# Patient Record
Sex: Male | Born: 1960 | Race: White | Hispanic: No | Marital: Married | Smoking: Never smoker
Health system: Southern US, Community
[De-identification: ages and names within clinical notes are randomized; demographics above are authoritative.]

## PROBLEM LIST (undated history)

## (undated) DIAGNOSIS — M1990 Unspecified osteoarthritis, unspecified site: Secondary | ICD-10-CM

## (undated) DIAGNOSIS — J019 Acute sinusitis, unspecified: Secondary | ICD-10-CM

## (undated) DIAGNOSIS — J309 Allergic rhinitis, unspecified: Secondary | ICD-10-CM

## (undated) DIAGNOSIS — T7840XA Allergy, unspecified, initial encounter: Secondary | ICD-10-CM

## (undated) DIAGNOSIS — R3919 Other difficulties with micturition: Secondary | ICD-10-CM

## (undated) DIAGNOSIS — E7800 Pure hypercholesterolemia, unspecified: Secondary | ICD-10-CM

## (undated) DIAGNOSIS — Z800 Family history of malignant neoplasm of digestive organs: Secondary | ICD-10-CM

## (undated) DIAGNOSIS — M542 Cervicalgia: Secondary | ICD-10-CM

## (undated) DIAGNOSIS — I10 Essential (primary) hypertension: Secondary | ICD-10-CM

## (undated) DIAGNOSIS — E785 Hyperlipidemia, unspecified: Secondary | ICD-10-CM

## (undated) DIAGNOSIS — J069 Acute upper respiratory infection, unspecified: Secondary | ICD-10-CM

## (undated) DIAGNOSIS — H9191 Unspecified hearing loss, right ear: Secondary | ICD-10-CM

## (undated) DIAGNOSIS — K921 Melena: Secondary | ICD-10-CM

## (undated) DIAGNOSIS — R001 Bradycardia, unspecified: Secondary | ICD-10-CM

## (undated) DIAGNOSIS — R079 Chest pain, unspecified: Secondary | ICD-10-CM

## (undated) HISTORY — PX: HERNIA MESH REMOVAL: SHX1745

## (undated) HISTORY — DX: Pure hypercholesterolemia, unspecified: E78.00

## (undated) HISTORY — DX: Essential (primary) hypertension: I10

## (undated) HISTORY — DX: Allergic rhinitis, unspecified: J30.9

## (undated) HISTORY — DX: Acute sinusitis, unspecified: J01.90

## (undated) HISTORY — DX: Unspecified hearing loss, right ear: H91.91

## (undated) HISTORY — PX: HAND SURGERY: SHX662

## (undated) HISTORY — DX: Bradycardia, unspecified: R00.1

## (undated) HISTORY — DX: Melena: K92.1

## (undated) HISTORY — PX: OTHER SURGICAL HISTORY: SHX169

## (undated) HISTORY — PX: COLONOSCOPY: SHX174

## (undated) HISTORY — DX: Chest pain, unspecified: R07.9

## (undated) HISTORY — DX: Allergy, unspecified, initial encounter: T78.40XA

## (undated) HISTORY — PX: TONSILLECTOMY: SUR1361

## (undated) HISTORY — DX: Other difficulties with micturition: R39.19

## (undated) HISTORY — DX: Unspecified osteoarthritis, unspecified site: M19.90

## (undated) HISTORY — DX: Hyperlipidemia, unspecified: E78.5

## (undated) HISTORY — DX: Cervicalgia: M54.2

## (undated) HISTORY — DX: Family history of malignant neoplasm of digestive organs: Z80.0

## (undated) HISTORY — DX: Acute upper respiratory infection, unspecified: J06.9

---

## 2004-10-02 ENCOUNTER — Ambulatory Visit: Payer: Self-pay | Admitting: Internal Medicine

## 2004-10-14 ENCOUNTER — Ambulatory Visit: Payer: Self-pay | Admitting: Internal Medicine

## 2007-04-22 ENCOUNTER — Ambulatory Visit: Payer: Self-pay | Admitting: Internal Medicine

## 2007-04-22 LAB — CONVERTED CEMR LAB
Albumin: 3.9 g/dL (ref 3.5–5.2)
Alkaline Phosphatase: 59 units/L (ref 39–117)
BUN: 11 mg/dL (ref 6–23)
Basophils Absolute: 0 10*3/uL (ref 0.0–0.1)
Bilirubin Urine: NEGATIVE
Cholesterol: 186 mg/dL (ref 0–200)
Eosinophils Absolute: 0.3 10*3/uL (ref 0.0–0.6)
GFR calc non Af Amer: 77 mL/min
HCT: 41.9 % (ref 39.0–52.0)
HDL: 38.2 mg/dL — ABNORMAL LOW (ref >39.0)
Hemoglobin, Urine: NEGATIVE
Hemoglobin: 14.4 g/dL (ref 13.0–17.0)
Ketones, ur: NEGATIVE mg/dL
LDL Cholesterol: 131 mg/dL — ABNORMAL HIGH (ref 0–99)
MCHC: 34.5 g/dL (ref 30.0–36.0)
MCV: 90.9 fL (ref 78.0–100.0)
Monocytes Absolute: 0.5 10*3/uL (ref 0.2–0.7)
Monocytes Relative: 8.6 % (ref 3.0–11.0)
Neutrophils Relative %: 57.1 % (ref 43.0–77.0)
PSA: 0.54 ng/mL (ref 0.10–4.00)
Potassium: 4.2 meq/L (ref 3.5–5.1)
RBC: 4.61 M/uL (ref 4.22–5.81)
RDW: 12.5 % (ref 11.5–14.6)
Sodium: 141 meq/L (ref 135–145)
TSH: 1.5 microintl units/mL (ref 0.35–5.50)
Triglycerides: 85 mg/dL (ref 0–149)
Urobilinogen, UA: 0.2 (ref 0.0–1.0)
VLDL: 17 mg/dL (ref 0–40)
pH: 7 (ref 5.0–8.0)

## 2007-05-09 ENCOUNTER — Ambulatory Visit: Payer: Self-pay | Admitting: Internal Medicine

## 2007-06-30 ENCOUNTER — Ambulatory Visit: Payer: Self-pay | Admitting: Internal Medicine

## 2007-06-30 LAB — CONVERTED CEMR LAB
AST: 26 units/L (ref 0–37)
Bilirubin, Direct: 0.2 mg/dL (ref 0.0–0.3)
Cholesterol: 177 mg/dL (ref 0–200)
HDL: 38.4 mg/dL — ABNORMAL LOW (ref >39.0)
Total Bilirubin: 1.1 mg/dL (ref 0.3–1.2)
Total CHOL/HDL Ratio: 4.6
Total Protein: 6.1 g/dL (ref 6.0–8.3)
Triglycerides: 78 mg/dL (ref 0–149)

## 2007-08-18 ENCOUNTER — Ambulatory Visit: Payer: Self-pay | Admitting: Internal Medicine

## 2007-08-18 DIAGNOSIS — E7800 Pure hypercholesterolemia, unspecified: Secondary | ICD-10-CM

## 2007-08-18 HISTORY — DX: Pure hypercholesterolemia, unspecified: E78.00

## 2007-08-18 LAB — CONVERTED CEMR LAB
ALT: 33 units/L (ref 0–53)
AST: 26 units/L (ref 0–37)
Alkaline Phosphatase: 51 units/L (ref 39–117)
Cholesterol: 139 mg/dL (ref 0–200)
LDL Cholesterol: 82 mg/dL (ref 0–99)
Total Bilirubin: 1 mg/dL (ref 0.3–1.2)
Total Protein: 6.2 g/dL (ref 6.0–8.3)

## 2007-09-27 ENCOUNTER — Telehealth: Payer: Self-pay | Admitting: Internal Medicine

## 2007-10-03 ENCOUNTER — Telehealth (INDEPENDENT_AMBULATORY_CARE_PROVIDER_SITE_OTHER): Payer: Self-pay | Admitting: *Deleted

## 2007-10-03 DIAGNOSIS — K921 Melena: Secondary | ICD-10-CM

## 2007-10-03 HISTORY — DX: Melena: K92.1

## 2007-10-21 ENCOUNTER — Ambulatory Visit: Payer: Self-pay | Admitting: Gastroenterology

## 2007-10-21 LAB — CONVERTED CEMR LAB
Basophils Absolute: 0 10*3/uL (ref 0.0–0.1)
Hemoglobin: 16 g/dL (ref 13.0–17.0)
MCHC: 35.8 g/dL (ref 30.0–36.0)
Monocytes Absolute: 0.5 10*3/uL (ref 0.2–0.7)
Monocytes Relative: 9.8 % (ref 3.0–11.0)
Neutro Abs: 3.3 10*3/uL (ref 1.4–7.7)
Platelets: 196 10*3/uL (ref 150–400)
RDW: 12.7 % (ref 11.5–14.6)

## 2007-11-14 ENCOUNTER — Encounter: Payer: Self-pay | Admitting: Internal Medicine

## 2007-11-14 ENCOUNTER — Ambulatory Visit: Payer: Self-pay | Admitting: Gastroenterology

## 2007-12-07 ENCOUNTER — Ambulatory Visit: Payer: Self-pay | Admitting: Internal Medicine

## 2007-12-07 DIAGNOSIS — J309 Allergic rhinitis, unspecified: Secondary | ICD-10-CM | POA: Insufficient documentation

## 2007-12-07 DIAGNOSIS — E785 Hyperlipidemia, unspecified: Secondary | ICD-10-CM

## 2007-12-07 DIAGNOSIS — R3919 Other difficulties with micturition: Secondary | ICD-10-CM

## 2007-12-07 DIAGNOSIS — R079 Chest pain, unspecified: Secondary | ICD-10-CM

## 2007-12-07 HISTORY — DX: Other difficulties with micturition: R39.19

## 2007-12-07 HISTORY — DX: Chest pain, unspecified: R07.9

## 2007-12-07 HISTORY — DX: Hyperlipidemia, unspecified: E78.5

## 2007-12-07 HISTORY — DX: Allergic rhinitis, unspecified: J30.9

## 2007-12-07 LAB — CONVERTED CEMR LAB
Bilirubin Urine: NEGATIVE
Leukocytes, UA: NEGATIVE
Specific Gravity, Urine: 1.01 (ref 1.000–1.03)
Total Protein, Urine: NEGATIVE mg/dL
Urine Glucose: NEGATIVE mg/dL
pH: 6.5 (ref 5.0–8.0)

## 2008-05-04 ENCOUNTER — Telehealth (INDEPENDENT_AMBULATORY_CARE_PROVIDER_SITE_OTHER): Payer: Self-pay | Admitting: *Deleted

## 2008-06-21 ENCOUNTER — Ambulatory Visit: Payer: Self-pay | Admitting: Internal Medicine

## 2008-06-21 LAB — CONVERTED CEMR LAB
Albumin: 4.2 g/dL (ref 3.5–5.2)
Alkaline Phosphatase: 50 units/L (ref 39–117)
BUN: 15 mg/dL (ref 6–23)
Calcium: 9.5 mg/dL (ref 8.4–10.5)
Eosinophils Relative: 6 % — ABNORMAL HIGH (ref 0.0–5.0)
GFR calc Af Amer: 93 mL/min
Glucose, Bld: 91 mg/dL (ref 70–99)
HCT: 46.7 % (ref 39.0–52.0)
Hemoglobin: 16.5 g/dL (ref 13.0–17.0)
Monocytes Absolute: 0.6 10*3/uL (ref 0.1–1.0)
Monocytes Relative: 9.9 % (ref 3.0–12.0)
Neutro Abs: 3.2 10*3/uL (ref 1.4–7.7)
Nitrite: NEGATIVE
Potassium: 4.7 meq/L (ref 3.5–5.1)
RBC: 4.98 M/uL (ref 4.22–5.81)
Total Protein, Urine: NEGATIVE mg/dL
Total Protein: 6.3 g/dL (ref 6.0–8.3)
WBC: 5.9 10*3/uL (ref 4.5–10.5)
pH: 7 (ref 5.0–8.0)

## 2008-06-29 ENCOUNTER — Ambulatory Visit: Payer: Self-pay | Admitting: Internal Medicine

## 2009-06-24 ENCOUNTER — Ambulatory Visit: Payer: Self-pay | Admitting: Internal Medicine

## 2009-06-24 LAB — CONVERTED CEMR LAB
ALT: 25 units/L (ref 0–53)
Albumin: 4.1 g/dL (ref 3.5–5.2)
Basophils Relative: 0 % (ref 0.0–3.0)
Bilirubin Urine: NEGATIVE
CO2: 31 meq/L (ref 19–32)
Chloride: 106 meq/L (ref 96–112)
Creatinine, Ser: 1.3 mg/dL (ref 0.4–1.5)
Eosinophils Absolute: 0.3 10*3/uL (ref 0.0–0.7)
HCT: 45.3 % (ref 39.0–52.0)
Hemoglobin, Urine: NEGATIVE
Hemoglobin: 15.6 g/dL (ref 13.0–17.0)
Ketones, ur: NEGATIVE mg/dL
Leukocytes, UA: NEGATIVE
Lymphs Abs: 1.4 10*3/uL (ref 0.7–4.0)
MCHC: 34.5 g/dL (ref 30.0–36.0)
MCV: 95.4 fL (ref 78.0–100.0)
Monocytes Absolute: 0.4 10*3/uL (ref 0.1–1.0)
Neutro Abs: 3.2 10*3/uL (ref 1.4–7.7)
Nitrite: NEGATIVE
PSA: 0.49 ng/mL (ref 0.10–4.00)
Potassium: 5 meq/L (ref 3.5–5.1)
RBC: 4.74 M/uL (ref 4.22–5.81)
Total CHOL/HDL Ratio: 3
Total Protein: 6.7 g/dL (ref 6.0–8.3)
Triglycerides: 73 mg/dL (ref 0.0–149.0)

## 2009-07-01 ENCOUNTER — Ambulatory Visit: Payer: Self-pay | Admitting: Internal Medicine

## 2009-11-12 ENCOUNTER — Telehealth: Payer: Self-pay | Admitting: Internal Medicine

## 2009-11-25 ENCOUNTER — Ambulatory Visit: Payer: Self-pay | Admitting: Internal Medicine

## 2009-11-25 DIAGNOSIS — J019 Acute sinusitis, unspecified: Secondary | ICD-10-CM

## 2009-11-25 HISTORY — DX: Acute sinusitis, unspecified: J01.90

## 2009-12-16 ENCOUNTER — Ambulatory Visit: Payer: Self-pay | Admitting: Internal Medicine

## 2009-12-16 LAB — CONVERTED CEMR LAB
ALT: 22 units/L (ref 0–53)
AST: 24 units/L (ref 0–37)
Bilirubin, Direct: 0.2 mg/dL (ref 0.0–0.3)
Total Bilirubin: 0.7 mg/dL (ref 0.3–1.2)
Total CHOL/HDL Ratio: 3
VLDL: 12 mg/dL (ref 0.0–40.0)

## 2009-12-18 ENCOUNTER — Telehealth: Payer: Self-pay | Admitting: Internal Medicine

## 2010-01-16 ENCOUNTER — Ambulatory Visit: Payer: Self-pay | Admitting: Internal Medicine

## 2010-01-16 LAB — CONVERTED CEMR LAB
Albumin: 4.2 g/dL (ref 3.5–5.2)
Alkaline Phosphatase: 42 units/L (ref 39–117)
Cholesterol: 140 mg/dL (ref 0–200)
LDL Cholesterol: 78 mg/dL (ref 0–99)
Total CHOL/HDL Ratio: 3
Total Protein: 6.2 g/dL (ref 6.0–8.3)
Triglycerides: 74 mg/dL (ref 0.0–149.0)
VLDL: 14.8 mg/dL (ref 0.0–40.0)

## 2010-04-28 ENCOUNTER — Telehealth: Payer: Self-pay | Admitting: Internal Medicine

## 2010-06-17 ENCOUNTER — Ambulatory Visit: Payer: Self-pay | Admitting: Internal Medicine

## 2010-06-17 LAB — CONVERTED CEMR LAB
AST: 22 units/L (ref 0–37)
Albumin: 4 g/dL (ref 3.5–5.2)
Alkaline Phosphatase: 41 units/L (ref 39–117)
Basophils Absolute: 0.1 10*3/uL (ref 0.0–0.1)
CO2: 28 meq/L (ref 19–32)
Chloride: 108 meq/L (ref 96–112)
Cholesterol: 156 mg/dL (ref 0–200)
Creatinine, Ser: 1.2 mg/dL (ref 0.4–1.5)
Eosinophils Absolute: 0.6 10*3/uL (ref 0.0–0.7)
HDL: 36.2 mg/dL — ABNORMAL LOW (ref >39.00)
Hemoglobin, Urine: NEGATIVE
Hemoglobin: 15.1 g/dL (ref 13.0–17.0)
LDL Cholesterol: 92 mg/dL (ref 0–99)
Lymphocytes Relative: 24.2 % (ref 12.0–46.0)
MCHC: 34.6 g/dL (ref 30.0–36.0)
Monocytes Relative: 8.3 % (ref 3.0–12.0)
Neutro Abs: 3.6 10*3/uL (ref 1.4–7.7)
Neutrophils Relative %: 56.6 % (ref 43.0–77.0)
Nitrite: NEGATIVE
Platelets: 152 10*3/uL (ref 150.0–400.0)
Potassium: 5.3 meq/L — ABNORMAL HIGH (ref 3.5–5.1)
RDW: 13.9 % (ref 11.5–14.6)
Specific Gravity, Urine: 1.02 (ref 1.000–1.030)
Total Bilirubin: 0.9 mg/dL (ref 0.3–1.2)
Total CHOL/HDL Ratio: 4
Total Protein, Urine: NEGATIVE mg/dL
Triglycerides: 138 mg/dL (ref 0.0–149.0)
Urobilinogen, UA: 0.2 (ref 0.0–1.0)
VLDL: 27.6 mg/dL (ref 0.0–40.0)

## 2010-07-07 ENCOUNTER — Ambulatory Visit: Payer: Self-pay | Admitting: Internal Medicine

## 2010-07-07 ENCOUNTER — Encounter: Payer: Self-pay | Admitting: Internal Medicine

## 2010-09-30 ENCOUNTER — Encounter: Payer: Self-pay | Admitting: Internal Medicine

## 2010-10-20 ENCOUNTER — Telehealth: Payer: Self-pay | Admitting: Internal Medicine

## 2010-11-11 NOTE — Progress Notes (Signed)
Summary: Lipitor  Phone Note Call from Patient Call back at Work Phone [phone]   Caller: Patient Summary of Call: pt called requesting to have chol meds changed to Lipitor because he has a savings coupon. Initial call taken by: Margaret Pyle, CMA,  December 18, 2009 1:55 PM  Follow-up for Phone Call        sounds good   - ok for lipitoir 20 mg per day - done escript  Ask for:  LAB only in 4 wks:    lipids 272.0                                                  hepatic function panel  v58.69 Follow-up by: Corwin Levins MD,  December 18, 2009 2:36 PM  Additional Follow-up for Phone Call Additional follow up Details #1::        labs scheduled 01/13/2010 and informed Additional Follow-up by: Margaret Pyle, CMA,  December 18, 2009 2:51 PM    New/Updated Medications: LIPITOR 20 MG TABS (ATORVASTATIN CALCIUM) 1po once daily Prescriptions: LIPITOR 20 MG TABS (ATORVASTATIN CALCIUM) 1po once daily  #30 x 11   Entered and Authorized by:   Corwin Levins MD   Signed by:   Corwin Levins MD on 12/18/2009   Method used:   [facility]        [facility]  [place] [address]       [address]       [place], [place]  [address]       Ph: [phone] or [phone]       Fax: [phone]   RxID:   [id number]

## 2010-11-11 NOTE — Progress Notes (Signed)
Summary: Labs  Phone Note Call from Patient Call back at Home Phone [phone]   Summary of Call: Patient left message on triage that he will not be in town the week before his CPX and would like to do the around Sept 2nd.  Left message on voicemail notifying patient that would be fine, only Medicare cannot do labs early/prior. Initial call taken by: Lucious Groves CMA,  April 28, 2010 3:47 PM

## 2010-11-11 NOTE — Assessment & Plan Note (Signed)
Summary: PHYSICAL--STC   Vital Signs:  Patient profile:   50 year old male Height:      68 inches Weight:      174.75 pounds BMI:     26.67 O2 Sat:      98 % on Room air Temp:     97.4 degrees F oral Pulse rate:   44 / minute BP sitting:   112 / 60  (left arm) Cuff size:   regular  Vitals Entered By: Zella Ball Ewing CMA Duncan Dull) (July 07, 2010 10:51 AM)  O2 Flow:  Room air  CC: ADult Physical/RE   CC:  ADult Physical/RE.  History of Present Illness: overall doing well, Pt denies CP, worsening sob, doe, wheezing, orthopnea, pnd, worsening LE edema, palps, dizziness or syncope  Pt denies new neuro symptoms such as headache, facial or extremity weakness  Pt denies polydipsia, polyuria.  Overall good compliance with meds, trying to follow low chol diet, wt stable, little excercise however No fever, wt loss, night sweats, loss of appetite or other constitutional symptoms   Preventive Screening-Counseling & Management      Drug Use:  no.    Problems Prior to Update: 1)  Sinusitis- Acute-nos  (ICD-461.9) 2)  Preventive Health Care  (ICD-V70.0) 3)  Dark Urine  (ICD-788.69) 4)  Chest Pain  (ICD-786.50) 5)  Allergic Rhinitis  (ICD-477.9) 6)  Hyperlipidemia  (ICD-272.4) 7)  Hematochezia  (ICD-578.1) 8)  Encounter For Long-term Use of Other Medications  (ICD-V58.69) 9)  Pure Hypercholesterolemia  (ICD-272.0)  Medications Prior to Update: 1)  Lipitor 20 Mg Tabs (Atorvastatin Calcium) .Marland Kitchen.. 1po Once Daily 2)  Adult Aspirin Ec Low Strength 81 Mg Tbec (Aspirin) .Marland Kitchen.. 1 By Mouth Once Daily 3)  Clarithromycin 500 Mg Tabs (Clarithromycin) .Marland Kitchen.. 1 By Mouth Two Times A Day (With Food)  Current Medications (verified): 1)  Lipitor 20 Mg Tabs (Atorvastatin Calcium) .Marland Kitchen.. 1po Once Daily 2)  Adult Aspirin Ec Low Strength 81 Mg Tbec (Aspirin) .Marland Kitchen.. 1 By Mouth Once Daily  Allergies (verified): 1)  ! 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         all otherwise negative per pt -    Physical Exam  General:  alert and well-developed. Head:  normocephalic and atraumatic.   Eyes:  vision grossly intact, pupils equal, and pupils round.   Ears:  R ear normal and L ear normal.   Nose:  no external deformity and no nasal discharge.   Mouth:  no gingival abnormalities and pharynx pink and moist.   Neck:  supple and no masses.   Lungs:  normal respiratory effort and normal breath sounds.   Heart:  normal rate and regular rhythm.   Abdomen:  soft, non-tender, and normal bowel sounds.   Msk:  no joint tenderness and no joint swelling.   Extremities:  no edema, no erythema  Neurologic:  cranial nerves II-XII intact and strength normal in all extremities.    Skin:  color normal and no rashes.   Psych:  not anxious appearing and not depressed appearing.     Impression & Recommendations:  Problem # 1:  Preventive Health Care (ICD-V70.0) Overall doing well, age appropriate education and counseling updated and referral for appropriate preventive services done unless declined, immunizations up to date or declined, diet counseling done if overweight, urged to quit smoking if smokes , most recent labs reviewed and current ordered if appropriate, ecg reviewed or declined (interpretation per ECG scanned in the EMR if done); information regarding Medicare Prevention requirements given if appropriate; speciality referrals updated as appropriate   Complete Medication List: 1)  Lipitor 20 Mg Tabs (Atorvastatin calcium) .Marland Kitchen.. 1po once daily 2)  Adult Aspirin Ec Low Strength 81 Mg Tbec (Aspirin) .Marland Kitchen.. 1 by mouth once daily  Other Orders: Admin 1st Vaccine ([address]) Flu Vaccine 25yrs + [phone])  Patient Instructions: 1)  you had the flu shot today 2)  Continue all previous medications as before this visit 3)  Please schedule a follow-up appointment in 1 year or sooner if needed Prescriptions: LIPITOR 20 MG TABS (ATORVASTATIN CALCIUM) 1po once daily  #90 x 3   Entered and Authorized by:   Corwin Levins MD   Signed by:   Corwin Levins MD on 07/07/2010   Method used:   Print then Give to Patient   RxID:   [id number]   Flu Vaccine Consent Questions     Do you have a history of severe allergic reactions to this vaccine? no    Any prior history of allergic reactions to egg and/or gelatin? no    Do you have a sensitivity to the preservative Thimersol? no    Do you have a past history of Guillan-Barre Syndrome? no    Do you currently have an acute febrile illness? no    Have you ever had a severe reaction to latex? no    Vaccine information given and explained to patient? yes    Are you currently pregnant? no    Lot Number:[id number]   Exp  Date:04/11/2011   Site Given  Left Deltoid IMbflu

## 2010-11-11 NOTE — Progress Notes (Signed)
Summary: Simvastatin  Phone Note Call from Patient Call back at Home Phone [phone]   Caller: Patient Summary of Call: pt called stating that Simvastatin is causing muscle cramps. pt is requesting alt Initial call taken by: Margaret Pyle, CMA,  November 12, 2009 4:53 PM  Follow-up for Phone Call        ok to try change to pravachol 40 mg per day - done escript; will need LAB only in 4 wks:  hepatic function panel  v58.69 Lipids 272.0 Follow-up by: Corwin Levins MD,  November 12, 2009 6:00 PM  Additional Follow-up for Phone Call Additional follow up Details #1::        labs scheduled for 12/11/2009. pt informed of labs and Rx Additional Follow-up by: Margaret Pyle, CMA,  November 13, 2009 9:01 AM    New/Updated Medications: PRAVACHOL 40 MG TABS (PRAVASTATIN SODIUM) 1 by mouth once daily Prescriptions: PRAVACHOL 40 MG TABS (PRAVASTATIN SODIUM) 1 by mouth once daily  #90 x 3   Entered and Authorized by:   Corwin Levins MD   Signed by:   Corwin Levins MD on 11/12/2009   Method used:   [facility]        [facility]  [place] [address]       [address]       [place], [place]  [address]       Ph: [phone] or [phone]       Fax: [phone]   RxID:   [id number]

## 2010-11-11 NOTE — Assessment & Plan Note (Signed)
Summary: SINUS PROBLEM  STC   Vital Signs:  Patient profile:   50 year old male Height:      68 inches Weight:      176.75 pounds BMI:     26.97 O2 Sat:      98 % on Room air Temp:     98.2 degrees F oral Pulse rate:   50 / minute BP sitting:   122 / 70  (left arm) Cuff size:   regular  Vitals Entered ByZella Ball Ewing (November 25, 2009 3:38 PM)  O2 Flow:  Room air CC: sinus congestion, headache/RE   CC:  sinus congestion and headache/RE.  History of Present Illness: here with 2 to 3 days osnet mild to mod facial pain, pressure, fever and greenish d/c, with slight ST, no cough and Pt denies CP, sob, doe, wheezing, orthopnea, pnd, worsening LE edema, palps, dizziness or syncope   Pt denies new neuro symptoms such as headache, facial or extremity weakness   Problems Prior to Update: 1)  Sinusitis- Acute-nos  (ICD-461.9) 2)  Preventive Health Care  (ICD-V70.0) 3)  Dark Urine  (ICD-788.69) 4)  Chest Pain  (ICD-786.50) 5)  Allergic Rhinitis  (ICD-477.9) 6)  Hyperlipidemia  (ICD-272.4) 7)  Hematochezia  (ICD-578.1) 8)  Encounter For Long-term Use of Other Medications  (ICD-V58.69) 9)  Pure Hypercholesterolemia  (ICD-272.0)  Medications Prior to Update: 1)  Pravachol 40 Mg Tabs (Pravastatin Sodium) .Marland Kitchen.. 1 By Mouth Once Daily 2)  Adult Aspirin Ec Low Strength 81 Mg Tbec (Aspirin) .Marland Kitchen.. 1 By Mouth Once Daily  Current Medications (verified): 1)  Pravachol 40 Mg Tabs (Pravastatin Sodium) .Marland Kitchen.. 1 By Mouth Once Daily 2)  Adult Aspirin Ec Low Strength 81 Mg Tbec (Aspirin) .Marland Kitchen.. 1 By Mouth Once Daily 3)  Clarithromycin 500 Mg Tabs (Clarithromycin) .Marland Kitchen.. 1 By Mouth Two Times A Day (With Food)  Allergies (verified): 1)  ! 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   Eyes:  vision grossly intact, pupils equal, and pupils round.   Ears:  bialt tm's red, sinus tender bilat Nose:  nasal dischargemucosal pallor and mucosal edema.   Mouth:  pharyngeal erythema and fair dentition.   Neck:  supple and cervical lymphadenopathy.   Lungs:  normal respiratory effort and normal breath sounds.   Heart:  normal rate and regular rhythm.   Extremities:  no edema, no erythema    Impression & Recommendations:  Problem # 1:  SINUSITIS- ACUTE-NOS (ICD-461.9)  with left ear fullness and fluid - tx with mucinex and antibx, f/u any worsening s/s  His updated medication list for this problem includes:    Clarithromycin 500 Mg Tabs (Clarithromycin) .Marland Kitchen... 1 by mouth two times a day (with food)  Complete Medication List: 1)  Pravachol 40 Mg Tabs (Pravastatin sodium) .Marland Kitchen.. 1 by mouth once daily 2)  Adult Aspirin Ec Low Strength 81 Mg Tbec (Aspirin) .Marland Kitchen.. 1 by mouth once daily 3)  Clarithromycin 500 Mg Tabs (Clarithromycin) .Marland KitchenMarland KitchenMarland Kitchen  1 by mouth two times a day (with food)  Patient Instructions: 1)  Please take all new medications as prescribed  2)  Continue all previous medications as before this visit  3)  You can also use Mucinex OTC or it's generic for congestion  4)  Please schedule a follow-up appointment Sept 2011 with CPX labs Prescriptions: CLARITHROMYCIN 500 MG TABS (CLARITHROMYCIN) 1 by mouth two times a day (with food)  #20 x 0   Entered and Authorized by:   Corwin Levins MD   Signed by:   Corwin Levins MD on 11/25/2009   Method used:   Print then Give to Patient   RxID:    [id number]

## 2010-11-13 NOTE — Progress Notes (Signed)
Summary: Generic Substitution  Phone Note From Pharmacy   Caller: [facility] Summary of Call: fax from [facility] requesting generic substitution for lipitor to save patient money. fax # [phone] Initial call taken by: Robin Ewing CMA Duncan Dull),  October 20, 2010 1:14 PM  Follow-up for Phone Call        generic ok  -  atorvastatin - ok to substitute if ok with pt  please notify pt Follow-up by: Corwin Levins MD,  October 20, 2010 1:16 PM  Additional Follow-up for Phone Call Additional follow up Details #1::        Called pt. left msg. to call back Additional Follow-up by: Robin Ewing CMA Duncan Dull),  October 20, 2010 1:29 PM    Additional Follow-up for Phone Call Additional follow up Details #2::    called pt. and he informed his prescription for Sept. he has not yet filled due to having Lipitor left from previous prescription. But his September 2011 prescription is for the generic. Follow-up by: Zella Ball Ewing CMA Duncan Dull),  October 20, 2010 5:12 PM

## 2010-11-13 NOTE — Consult Note (Signed)
Summary: [facility]  [facility]   Imported By: Lester Ferrum 10/08/2010 10:51:51  _____________________________________________________________________  External Attachment:    Type:   Image     Comment:   External Document

## 2010-11-18 ENCOUNTER — Encounter: Payer: Self-pay | Admitting: Internal Medicine

## 2010-11-18 ENCOUNTER — Ambulatory Visit (INDEPENDENT_AMBULATORY_CARE_PROVIDER_SITE_OTHER): Payer: Managed Care, Other (non HMO) | Admitting: Internal Medicine

## 2010-11-18 DIAGNOSIS — M542 Cervicalgia: Secondary | ICD-10-CM

## 2010-11-18 DIAGNOSIS — J069 Acute upper respiratory infection, unspecified: Secondary | ICD-10-CM | POA: Insufficient documentation

## 2010-11-18 DIAGNOSIS — J309 Allergic rhinitis, unspecified: Secondary | ICD-10-CM

## 2010-11-18 HISTORY — DX: Acute upper respiratory infection, unspecified: J06.9

## 2010-11-18 HISTORY — DX: Cervicalgia: M54.2

## 2010-11-24 ENCOUNTER — Telehealth: Payer: Self-pay | Admitting: Internal Medicine

## 2010-11-27 NOTE — Assessment & Plan Note (Signed)
Summary: BACK OF NECK PAIN---STC   Vital Signs:  Patient profile:   50 year old male Height:      68 inches Weight:      178 pounds BMI:     27.16 O2 Sat:      98 % on Room air Temp:     98 degrees F oral Pulse rate:   36 / minute BP sitting:   128 / 70  (left arm) Cuff size:   regular  Vitals Entered By: Zella Ball Ewing CMA (AAMA) (November 18, 2010 2:29 PM)  O2 Flow:  Room air  CC: Neck pain, sinus congestion/RE   CC:  Neck pain and sinus congestion/RE.  History of Present Illness: here with 2 wks onset URI symptoms with HA, fever, sinus pain, pressure, and colored d/c, gradually worsening with mild ST and nonprod cough but Pt denies CP, worsening sob, doe, wheezing, orthopnea, pnd, worsening LE edema, palps, dizziness or syncope  Pt denies new neuro symptoms such as facial or extremity weakness  .  Pt denies polydipsia, polyuria.   Overall good compliance with meds, trying to follow low chol diet, wt stable, little excercise however .  Also has post left upper aspect neck pain, without radiation, worse to turn head to the left , and not assoc with LUE pain/weak/numb, gait change, falls, bowel or bladder change, fever, wt loss.  No prior hx, no obvious inciting event such as heavy lifitng, fall or other injury.   Pt denies CP, worsening sob, doe, wheezing, orthopnea, pnd, worsening LE edema, palps, dizziness or syncope  Pt denies new neuro symptoms such as facial or extremity weakness  Does have mild nasal allergy symptoms ongoing intemittent with itch, sneeze prior to onset current sympoms for several months, without fever, headache, colored d/c.    Problems Prior to Update: 1)  Neck Pain  (ICD-723.1) 2)  Uri  (ICD-465.9) 3)  Sinusitis- Acute-nos  (ICD-461.9) 4)  Preventive Health Care  (ICD-V70.0) 5)  Dark Urine  (ICD-788.69) 6)  Chest Pain  (ICD-786.50) 7)  Allergic Rhinitis  (ICD-477.9) 8)  Hyperlipidemia  (ICD-272.4) 9)  Hematochezia  (ICD-578.1) 10)  Encounter For Long-term  Use of Other Medications  (ICD-V58.69) 11)  Pure Hypercholesterolemia  (ICD-272.0)  Medications Prior to Update: 1)  Lipitor 20 Mg Tabs (Atorvastatin Calcium) .Marland Kitchen.. 1po Once Daily 2)  Adult Aspirin Ec Low Strength 81 Mg Tbec (Aspirin) .Marland Kitchen.. 1 By Mouth Once Daily  Current Medications (verified): 1)  Lipitor 20 Mg Tabs (Atorvastatin Calcium) .Marland Kitchen.. 1po Once Daily 2)  Adult Aspirin Ec Low Strength 81 Mg Tbec (Aspirin) .Marland Kitchen.. 1 By Mouth Once Daily 3)  Azithromycin 250 Mg Tabs (Azithromycin) .... 2po Qd For 1 Day, Then 1po Qd For 4days, Then Stop  Allergies (verified): 1)  ! 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 Head:  normocephalic and atraumatic.   Eyes:  vision grossly intact, pupils equal, and pupils round.   Ears:  bilat tms' midl erythema, sinus nontender Nose:  nasal dischargemucosal pallor and mucosal edema.   Mouth:  pharyngeal erythema and fair dentition.   Neck:  supple and cervical lymphadenopathy.   Lungs:  normal respiratory effort and normal breath sounds.   Heart:  normal rate and regular rhythm.   Msk:  mild tender left upper paracervical area, no swelling,redness or rash, no spine tender Extremities:  no edema, no erythema  Neurologic:  strength normal in all extremities, sensation intact to light touch, gait normal, and DTRs symmetrical and normal.   Skin:  color normal and no rashes.   Psych:  not depressed  appearing and slightly anxious.     Impression & Recommendations:  Problem # 1:  URI (ICD-465.9)  His updated medication list for this problem includes:    Adult Aspirin Ec Low Strength 81 Mg Tbec (Aspirin) .Marland Kitchen... 1 by mouth once daily treat as above, f/u any worsening signs or symptoms = for zpack x 1  Instructed on symptomatic treatment. Call if symptoms persist or worsen.   Problem # 2:  NECK PAIN (ICD-723.1)  His updated medication list for this problem includes:    Adult Aspirin Ec Low Strength 81 Mg Tbec (Aspirin) .Marland Kitchen... 1 by mouth once daily left upper paracervical only - ? lyphadentiis related to #1 vs MSK strain vs underlying c-spine DJD/DDD - exam benign for cervical radiculpathy;  ok for otc advil as needed , f/u any worsening s/s  Discussed exercises and use of moist heat or cold and medication.   Problem # 3:  ALLERGIC RHINITIS (ICD-477.9)  Discussed use of allergy medications and environmental measures.   Ok for [employer] as needed   Complete Medication List: 1)  Lipitor 20 Mg Tabs (Atorvastatin calcium) .Marland Kitchen.. 1po once daily 2)  Adult Aspirin Ec Low Strength 81 Mg Tbec (Aspirin) .Marland Kitchen.. 1 by mouth once daily 3)  Azithromycin 250 Mg Tabs (Azithromycin) .... 2po qd for 1 day, then 1po qd for 4days, then stop  Patient Instructions: 1)  Please take all new medications as prescribed 2)  Continue all previous medications as before this visit  3)  Please call for muscle relaxer if the neck pain persists or gets worse 4)  Please schedule a follow-up appointment in Sept 2012 for CPX with labs Prescriptions: AZITHROMYCIN 250 MG TABS (AZITHROMYCIN) 2po qd for 1 day, then 1po qd for 4days, then stop  #6 x 1   Entered and Authorized by:   Corwin Levins MD   Signed by:   Corwin Levins MD on 11/18/2010   Method used:   Print then Give to Patient   RxID:   [id number]    Orders Added: 1)  Est. Patient Level IV [65784]

## 2010-12-03 NOTE — Progress Notes (Signed)
Summary: generic substitution  Phone Note From Pharmacy   Caller: [facility] Summary of Call: Pharmacy requesting generic substitution for lipitor in order for pt. to save money? Initial call taken by: Robin Ewing CMA Duncan Dull),  November 24, 2010 9:09 AM  Follow-up for Phone Call        lipitor is generic now  I dont think there is need to change , unless there is some other aspect to the issue, such as the insurance wont pay for even generic lipitor Follow-up by: Corwin Levins MD,  November 24, 2010 9:29 AM  Additional Follow-up for Phone Call Additional follow up Details #1::        Informed pharmacy Additional Follow-up by: Robin Ewing CMA Duncan Dull),  November 24, 2010 9:38 AM

## 2010-12-22 ENCOUNTER — Telehealth: Payer: Self-pay | Admitting: Internal Medicine

## 2010-12-30 NOTE — Progress Notes (Signed)
  Phone Note Refill Request Message from:  Fax from Pharmacy on December 22, 2010 1:20 PM  Refills Requested: Medication #1:  LIPITOR 20 MG TABS 1po once daily   Dosage confirmed as above?Dosage Confirmed   Notes: [facility] Initial call taken by: Robin Ewing CMA (AAMA),  December 22, 2010 1:21 PM    Prescriptions: LIPITOR 20 MG TABS (ATORVASTATIN CALCIUM) 1po once daily  #90 x 3   Entered by:   Scharlene Gloss CMA (AAMA)   Authorized by:   Corwin Levins MD   Signed by:   Scharlene Gloss CMA (AAMA) on 12/22/2010   Method used:   Faxed to ...       [facility] (mail-order)             , [place]         Ph: [phone]       Fax: [phone]   RxID:   [id number]

## 2011-02-24 NOTE — Assessment & Plan Note (Signed)
 HEALTHCARE                         GASTROENTEROLOGY OFFICE NOTE   Curtis, Jaime                     MRN:          [id number]  DATE:10/21/2007                            DOB:          29-Dec-1960    REASON FOR REFERRAL:  Dr. Jonny Ruiz asked me to evaluate Jaime Curtis in  consultation regarding history of intermittent rectal bleeding.   HISTORY OF PRESENT ILLNESS:  Jaime Curtis is a very pleasant 50 year old  man who has had several years of mild intermittent rectal bleeding at  the time of solid bowel movements.  He is not particularly constipated,  does not have to strain to move his bowels, but every once and a while  will see what sounds like a small amount of blood on the tissue paper or  dripping into the toilet.  He had this evaluated once when he was in  [place] living there.  He was told that it was from hemorrhoids, he did  not have a full colonoscopy, I think he just had an anoscopy in the  office.   REVIEW OF SYSTEMS:  Notable for stable weight, mild occasional abdominal  cramping, is not bothersome to him and otherwise his review of systems  is essentially normal and is available on his nursing intake sheet.   PAST MEDICAL HISTORY:  Elevated cholesterol.   CURRENT MEDICINES:  Simvastatin.   ALLERGIES:  SUDAFED.   SOCIAL HISTORY:  Married with 3 children, works as an Art gallery manager for  [employer].  Nonsmoker, occasionally drinks alcohol.   FAMILY HISTORY:  No colon cancer or colon polyps in family.   PHYSICAL EXAMINATION:  Five foot eight inches, 186 pounds, blood  pressure 132/68, pulse 60.  CONSTITUTIONAL:  Generally well-appearing.  NEUROLOGIC:  Alert and oriented x3.  EYES:  Extraocular movements intact.  MOUTH:  Oropharynx moist, no lesions.  NECK:  Supple, no lymphadenopathy.  CARDIOVASCULAR:  Heart regular rate and rhythm.  LUNGS:  Clear to auscultation bilaterally.  ABDOMEN:  Soft, nontender, nondistended, normal bowel sounds.  EXTREMITIES:  No lower extremity edema.  SKIN:  No rashes or lesions on visible extremities.   ASSESSMENT/PLAN:  A 50 year old man with mild intermittent rectal  bleeding.   First, Jaime Curtis does not appear to be anemic clinically, but I will  arrange for him to have a CBC just to be certain.  He had a normal  hemoglobin back in July of 2008 on routine lab testing.  His rectal  bleeding sounds like it is indeed hemorrhoidal in nature and fairly  mild.  That being said, I will arrange for him to have a full  colonoscopy performed at his soonest convenience to make sure that he  does not have any low-lying neoplastic causes or colitis.  I see no  reason for any further blood tests or imaging studies other than the CBC  I mentioned above.     Rachael Fee, MD  Electronically Signed    DPJ/MedQ  DD: 10/21/2007  DT: 10/21/2007  Job #: [id number]   cc:   Corwin Levins, MD

## 2011-06-23 DIAGNOSIS — K921 Melena: Secondary | ICD-10-CM

## 2011-06-27 ENCOUNTER — Other Ambulatory Visit: Payer: Self-pay | Admitting: Internal Medicine

## 2011-06-27 DIAGNOSIS — Z0000 Encounter for general adult medical examination without abnormal findings: Secondary | ICD-10-CM

## 2011-07-05 ENCOUNTER — Encounter: Payer: Self-pay | Admitting: Internal Medicine

## 2011-07-05 DIAGNOSIS — Z0001 Encounter for general adult medical examination with abnormal findings: Secondary | ICD-10-CM | POA: Insufficient documentation

## 2011-07-05 DIAGNOSIS — Z0000 Encounter for general adult medical examination without abnormal findings: Secondary | ICD-10-CM | POA: Insufficient documentation

## 2011-07-06 ENCOUNTER — Other Ambulatory Visit: Payer: Managed Care, Other (non HMO)

## 2011-07-06 ENCOUNTER — Other Ambulatory Visit (INDEPENDENT_AMBULATORY_CARE_PROVIDER_SITE_OTHER): Payer: Managed Care, Other (non HMO)

## 2011-07-06 DIAGNOSIS — Z0000 Encounter for general adult medical examination without abnormal findings: Secondary | ICD-10-CM

## 2011-07-06 LAB — COMPREHENSIVE METABOLIC PANEL
AST: 23 U/L (ref 0–37)
Albumin: 3.9 g/dL (ref 3.5–5.2)
Alkaline Phosphatase: 41 U/L (ref 39–117)
BUN: 19 mg/dL (ref 6–23)
Creatinine, Ser: 1.2 mg/dL (ref 0.4–1.5)
Potassium: 4.5 mEq/L (ref 3.5–5.1)
Total Bilirubin: 0.9 mg/dL (ref 0.3–1.2)

## 2011-07-06 LAB — CBC WITH DIFFERENTIAL/PLATELET
Basophils Relative: 0.8 % (ref 0.0–3.0)
Eosinophils Absolute: 0.3 10*3/uL (ref 0.0–0.7)
Lymphs Abs: 1.3 10*3/uL (ref 0.7–4.0)
MCHC: 33.3 g/dL (ref 30.0–36.0)
MCV: 94 fl (ref 78.0–100.0)
Monocytes Absolute: 0.5 10*3/uL (ref 0.1–1.0)
Neutrophils Relative %: 60 % (ref 43.0–77.0)
RBC: 4.75 Mil/uL (ref 4.22–5.81)

## 2011-07-06 LAB — URINALYSIS, ROUTINE W REFLEX MICROSCOPIC
Hgb urine dipstick: NEGATIVE
Ketones, ur: NEGATIVE
Total Protein, Urine: NEGATIVE
Urine Glucose: NEGATIVE

## 2011-07-06 LAB — LIPID PANEL
Cholesterol: 131 mg/dL (ref 0–200)
HDL: 40.1 mg/dL (ref >39.00)
LDL Cholesterol: 80 mg/dL (ref 0–99)
Total CHOL/HDL Ratio: 3
Triglycerides: 54 mg/dL (ref 0.0–149.0)

## 2011-07-08 ENCOUNTER — Encounter: Payer: Managed Care, Other (non HMO) | Admitting: Internal Medicine

## 2011-07-10 ENCOUNTER — Other Ambulatory Visit (INDEPENDENT_AMBULATORY_CARE_PROVIDER_SITE_OTHER): Payer: Managed Care, Other (non HMO)

## 2011-07-10 ENCOUNTER — Encounter: Payer: Self-pay | Admitting: Internal Medicine

## 2011-07-10 ENCOUNTER — Ambulatory Visit (INDEPENDENT_AMBULATORY_CARE_PROVIDER_SITE_OTHER): Payer: Managed Care, Other (non HMO) | Admitting: Internal Medicine

## 2011-07-10 VITALS — BP 122/70 | HR 40 | Temp 98.3°F | Ht 68.0 in | Wt 180.4 lb

## 2011-07-10 DIAGNOSIS — Z23 Encounter for immunization: Secondary | ICD-10-CM

## 2011-07-10 DIAGNOSIS — Z0000 Encounter for general adult medical examination without abnormal findings: Secondary | ICD-10-CM

## 2011-07-10 DIAGNOSIS — R001 Bradycardia, unspecified: Secondary | ICD-10-CM

## 2011-07-10 DIAGNOSIS — M722 Plantar fascial fibromatosis: Secondary | ICD-10-CM | POA: Insufficient documentation

## 2011-07-10 HISTORY — DX: Bradycardia, unspecified: R00.1

## 2011-07-10 NOTE — Assessment & Plan Note (Signed)
Mild, or advil prn, consider [place] orth[facility] - Dr Lestine Box or Victorino Dike for worsening

## 2011-07-10 NOTE — Patient Instructions (Addendum)
You had the flu shot today Please go to LAB in the Basement for the blood test - PSA Continue all other medications as before Please call the phone number [phone] (the PhoneTree System) for results of testing in 2-3 days;  When calling, simply dial the number, and when prompted enter the MRN number above (the Medical Record Number) and the # key, then the message should start. Please return in 1 year for your yearly visit, or sooner if needed, with Lab testing done 3-5 days before

## 2011-07-10 NOTE — Progress Notes (Signed)
Subjective:    Patient ID: Jaime Curtis, male    DOB: 01-01-1961, 50 y.o.   MRN: [id number]  HPI  Here for wellness and f/u;  Overall doing ok;  Pt denies CP, worsening SOB, DOE, wheezing, orthopnea, PND, worsening LE edema, palpitations, dizziness or syncope.  Pt denies neurological change such as new Headache, facial or extremity weakness.  Pt denies polydipsia, polyuria, or low sugar symptoms. Pt states overall good compliance with treatment and medications, good tolerability, and trying to follow lower cholesterol diet.  Pt denies worsening depressive symptoms, suicidal ideation or panic. No fever, wt loss, night sweats, loss of appetite, or other constitutional symptoms.  Pt states good ability with ADL's, low fall risk, home safety reviewed and adequate, no significant changes in hearing or vision, and occasionally active with exercise.  Has had 3 yrs ongoing plantar fasciitis type pain with running, despite good shoes.   Past Medical History  Diagnosis Date  . ALLERGIC RHINITIS 12/07/2007  . CHEST PAIN 12/07/2007  . DARK URINE 12/07/2007  . HEMATOCHEZIA 10/03/2007  . HYPERLIPIDEMIA 12/07/2007  . NECK PAIN 11/18/2010  . Pure hypercholesterolemia 08/18/2007  . SINUSITIS- ACUTE-NOS 11/25/2009  . URI 11/18/2010  . Bradycardia, sinus 07/10/2011   Past Surgical History  Procedure Date  . Inguinal herniorrhapy     left  . Tonsillectomy   . Hx of fractured right scapula     reports that he has never smoked. He does not have any smokeless tobacco history on file. He reports that he drinks alcohol. He reports that he does not use illicit drugs. family history includes Cancer in his maternal grandmother and Diabetes in his sister. Allergies  Allergen Reactions  . Pseudoephedrine    Current Outpatient Prescriptions on File Prior to Visit  Medication Sig Dispense Refill  . aspirin 81 MG tablet Take 81 mg by mouth daily.        Marland Kitchen atorvastatin (LIPITOR) 20 MG tablet Take 20 mg by mouth daily.          Review of Systems Review of Systems  Constitutional: Negative for diaphoresis, activity change, appetite change and unexpected weight change.  HENT: Negative for hearing loss, ear pain, facial swelling, mouth sores and neck stiffness.   Eyes: Negative for pain, redness and visual disturbance.  Respiratory: Negative for shortness of breath and wheezing.   Cardiovascular: Negative for chest pain and palpitations.  Gastrointestinal: Negative for diarrhea, blood in stool, abdominal distention and rectal pain.  Genitourinary: Negative for hematuria, flank pain and decreased urine volume.  Musculoskeletal: Negative for myalgias and joint swelling.  Skin: Negative for color change and wound.  Neurological: Negative for syncope and numbness.  Hematological: Negative for adenopathy.  Psychiatric/Behavioral: Negative for hallucinations, self-injury, decreased concentration and agitation.      Objective:   Physical Exam BP 122/70  Pulse 40  Temp(Src) 98.3 F (36.8 C) (Oral)  Ht 5\' 8"  (1.727 m)  Wt 180 lb 6 oz (81.818 kg)  BMI 27.43 kg/m2  SpO2 98% Physical Exam  VS noted Constitutional: Pt is oriented to person, place, and time. Appears well-developed and well-nourished.  HENT:  Head: Normocephalic and atraumatic.  Right Ear: External ear normal.  Left Ear: External ear normal.  Nose: Nose normal.  Mouth/Throat: Oropharynx is clear and moist.  Eyes: Conjunctivae and EOM are normal. Pupils are equal, round, and reactive to light.  Neck: Normal range of motion. Neck supple. No JVD present. No tracheal deviation present.  Cardiovascular: Normal rate, regular rhythm,  normal heart sounds and intact distal pulses.   Pulmonary/Chest: Effort normal and breath sounds normal.  Abdominal: Soft. Bowel sounds are normal. There is no tenderness.  Musculoskeletal: Normal range of motion. Exhibits no edema.  Lymphadenopathy:  Has no cervical adenopathy.  Neurological: Pt is alert and oriented to  person, place, and time. Pt has normal reflexes. No cranial nerve deficit.  Skin: Skin is warm and dry. No rash noted.  Psychiatric:  Has  normal mood and affect. Behavior is normal.     Assessment & Plan:

## 2011-07-10 NOTE — Assessment & Plan Note (Signed)

## 2011-07-10 NOTE — Progress Notes (Signed)
Addended by: Scharlene Gloss B on: 07/10/2011 03:58 PM   Modules accepted: Orders

## 2012-04-22 ENCOUNTER — Other Ambulatory Visit: Payer: Self-pay

## 2012-04-22 MED ORDER — ATORVASTATIN CALCIUM 20 MG PO TABS: 20.0000 mg | ORAL_TABLET | Freq: Every day | ORAL | Status: DC

## 2012-07-27 ENCOUNTER — Other Ambulatory Visit (INDEPENDENT_AMBULATORY_CARE_PROVIDER_SITE_OTHER): Payer: Managed Care, Other (non HMO)

## 2012-07-27 DIAGNOSIS — Z0000 Encounter for general adult medical examination without abnormal findings: Secondary | ICD-10-CM

## 2012-07-27 LAB — CBC WITH DIFFERENTIAL/PLATELET
Basophils Relative: 0.6 % (ref 0.0–3.0)
Eosinophils Relative: 4 % (ref 0.0–5.0)
Hemoglobin: 15.8 g/dL (ref 13.0–17.0)
MCV: 93.3 fl (ref 78.0–100.0)
Monocytes Absolute: 0.4 10*3/uL (ref 0.1–1.0)
Neutro Abs: 4.1 10*3/uL (ref 1.4–7.7)
Neutrophils Relative %: 66.8 % (ref 43.0–77.0)
RBC: 5.12 Mil/uL (ref 4.22–5.81)
WBC: 6.2 10*3/uL (ref 4.5–10.5)

## 2012-07-27 LAB — LIPID PANEL
HDL: 39.6 mg/dL (ref >39.00)
LDL Cholesterol: 106 mg/dL — ABNORMAL HIGH (ref 0–99)
Total CHOL/HDL Ratio: 4
Triglycerides: 91 mg/dL (ref 0.0–149.0)

## 2012-07-27 LAB — BASIC METABOLIC PANEL
Calcium: 9.2 mg/dL (ref 8.4–10.5)
Creatinine, Ser: 1.1 mg/dL (ref 0.4–1.5)

## 2012-07-27 LAB — URINALYSIS, ROUTINE W REFLEX MICROSCOPIC
Bilirubin Urine: NEGATIVE
Ketones, ur: NEGATIVE
Nitrite: NEGATIVE
Total Protein, Urine: NEGATIVE
Urine Glucose: NEGATIVE
pH: 6.5 (ref 5.0–8.0)

## 2012-07-27 LAB — HEPATIC FUNCTION PANEL
AST: 20 U/L (ref 0–37)
Albumin: 3.8 g/dL (ref 3.5–5.2)
Alkaline Phosphatase: 44 U/L (ref 39–117)
Total Bilirubin: 0.8 mg/dL (ref 0.3–1.2)

## 2012-08-01 ENCOUNTER — Ambulatory Visit (INDEPENDENT_AMBULATORY_CARE_PROVIDER_SITE_OTHER): Payer: Managed Care, Other (non HMO) | Admitting: Internal Medicine

## 2012-08-01 ENCOUNTER — Encounter: Payer: Self-pay | Admitting: Internal Medicine

## 2012-08-01 VITALS — BP 110/68 | HR 40 | Temp 97.6°F | Ht 68.0 in | Wt 185.2 lb

## 2012-08-01 DIAGNOSIS — Z23 Encounter for immunization: Secondary | ICD-10-CM

## 2012-08-01 DIAGNOSIS — M79671 Pain in right foot: Secondary | ICD-10-CM | POA: Insufficient documentation

## 2012-08-01 DIAGNOSIS — M79609 Pain in unspecified limb: Secondary | ICD-10-CM

## 2012-08-01 DIAGNOSIS — Z0000 Encounter for general adult medical examination without abnormal findings: Secondary | ICD-10-CM

## 2012-08-01 MED ORDER — ATORVASTATIN CALCIUM 20 MG PO TABS: 20.0000 mg | ORAL_TABLET | Freq: Every day | ORAL | Status: DC

## 2012-08-01 NOTE — Assessment & Plan Note (Addendum)

## 2012-08-01 NOTE — Progress Notes (Signed)
Subjective:    Patient ID: Jaime Curtis, male    DOB: Mar 04, 1961, 51 y.o.   MRN: [id number]  HPI Here for wellness and f/u;  Overall doing ok;  Pt denies CP, worsening SOB, DOE, wheezing, orthopnea, PND, worsening LE edema, palpitations, dizziness or syncope.  Pt denies neurological change such as new Headache, facial or extremity weakness.  Pt denies polydipsia, polyuria, or low sugar symptoms. Pt states overall good compliance with treatment and medications, good tolerability, and trying to follow lower cholesterol diet.  Pt denies worsening depressive symptoms, suicidal ideation or panic. No fever, wt loss, night sweats, loss of appetite, or other constitutional symptoms.  Pt states good ability with ADL's, low fall risk, home safety reviewed and adequate, no significant changes in hearing or vision, and occasionally active with exercise.  Does have ongoing recurrent medial right heal pain worse to run, so this tends to limit his activity.  For flu shot today Past Medical History  Diagnosis Date  . ALLERGIC RHINITIS 12/07/2007  . CHEST PAIN 12/07/2007  . DARK URINE 12/07/2007  . HEMATOCHEZIA 10/03/2007  . HYPERLIPIDEMIA 12/07/2007  . NECK PAIN 11/18/2010  . Pure hypercholesterolemia 08/18/2007  . SINUSITIS- ACUTE-NOS 11/25/2009  . URI 11/18/2010  . Bradycardia, sinus 07/10/2011   Past Surgical History  Procedure Date  . Inguinal herniorrhapy     left  . Tonsillectomy   . Hx of fractured right scapula     reports that he has never smoked. He does not have any smokeless tobacco history on file. He reports that he drinks alcohol. He reports that he does not use illicit drugs. family history includes Cancer in his maternal grandmother and Diabetes in his sister. Allergies  Allergen Reactions  . Pseudoephedrine    Current Outpatient Prescriptions on File Prior to Visit  Medication Sig Dispense Refill  . aspirin 81 MG tablet Take 81 mg by mouth daily.        Marland Kitchen DISCONTD: atorvastatin (LIPITOR)  20 MG tablet Take 1 tablet (20 mg total) by mouth daily.  90 tablet  0   Review of Systems Review of Systems  Constitutional: Negative for diaphoresis, activity change, appetite change and unexpected weight change.  HENT: Negative for hearing loss, ear pain, facial swelling, mouth sores and neck stiffness.   Eyes: Negative for pain, redness and visual disturbance.  Respiratory: Negative for shortness of breath and wheezing.   Cardiovascular: Negative for chest pain and palpitations.  Gastrointestinal: Negative for diarrhea, blood in stool, abdominal distention and rectal pain.  Genitourinary: Negative for hematuria, flank pain and decreased urine volume.  Musculoskeletal: Negative for myalgias and joint swelling.  Skin: Negative for color change and wound.  Neurological: Negative for syncope and numbness.  Hematological: Negative for adenopathy.  Psychiatric/Behavioral: Negative for hallucinations, self-injury, decreased concentration and agitation.      Objective:   Physical Exam BP 110/68  Pulse 40  Temp 97.6 F (36.4 C) (Oral)  Ht 5\' 8"  (1.727 m)  Wt 185 lb 4 oz (84.029 kg)  BMI 28.17 kg/m2  SpO2 98% Physical Exam  VS noted Constitutional: Pt is oriented to person, place, and time. Appears well-developed and well-nourished.  HENT:  Head: Normocephalic and atraumatic.  Right Ear: External ear normal.  Left Ear: External ear normal.  Nose: Nose normal.  Mouth/Throat: Oropharynx is clear and moist.  Eyes: Conjunctivae and EOM are normal. Pupils are equal, round, and reactive to light.  Neck: Normal range of motion. Neck supple. No JVD present. No  tracheal deviation present.  Cardiovascular: Normal rate, regular rhythm, normal heart sounds and intact distal pulses.   Pulmonary/Chest: Effort normal and breath sounds normal.  Abdominal: Soft. Bowel sounds are normal. There is no tenderness.  Musculoskeletal: Normal range of motion. Exhibits no edema.  Lymphadenopathy:  Has no  cervical adenopathy.  Neurological: Pt is alert and oriented to person, place, and time. Pt has normal reflexes. No cranial nerve deficit.  Skin: Skin is warm and dry. No rash noted.  Psychiatric:  Has  normal mood and affect. Behavior is normal.  Right medial heel without erythema, ulcer, swelling    Assessment & Plan:

## 2012-08-01 NOTE — Assessment & Plan Note (Signed)
Activity limiting, for ortho referral

## 2012-08-01 NOTE — Patient Instructions (Addendum)
Continue all other medications as before Your refill was sent today, as well as you are given the hardcopy for the lipitor for next yr You had the flu shot today You are otherwise up to date with prevention measures You will be contacted regarding the referral for: Dr Fields/orthopedic for the right heel Please continue your efforts at being more active, low cholesterol diet, and weight control. Please return in 1 year for your yearly visit, or sooner if needed, with Lab testing done 3-5 days before Please remember to sign up for My Chart at your earliest convenience, as this will be important to you in the future with finding out test results.

## 2012-08-30 ENCOUNTER — Encounter: Payer: Self-pay | Admitting: Sports Medicine

## 2012-08-30 ENCOUNTER — Ambulatory Visit (INDEPENDENT_AMBULATORY_CARE_PROVIDER_SITE_OTHER): Payer: Managed Care, Other (non HMO) | Admitting: Sports Medicine

## 2012-08-30 VITALS — BP 147/90 | HR 60 | Ht 68.0 in | Wt 175.0 lb

## 2012-08-30 DIAGNOSIS — M722 Plantar fascial fibromatosis: Secondary | ICD-10-CM

## 2012-08-30 DIAGNOSIS — M2170 Unequal limb length (acquired), unspecified site: Secondary | ICD-10-CM | POA: Insufficient documentation

## 2012-08-30 NOTE — Assessment & Plan Note (Signed)
Prostep insole fitted with wedged lift on RT  Walking gait improved Less pain noted

## 2012-08-30 NOTE — Progress Notes (Signed)
  Subjective:    Patient ID: Jaime Curtis, male    DOB: 22-Mar-1961, 51 y.o.   MRN: [id number]  HPI Referred courtesy of Dr Jonny Ruiz at [facility]  Pt presents to clinic for evaluation of rt heel pain that he has had for about 2 years. Pain is located near center and medial aspect of heel.  Running more on treadmill 2 yrs ago when pain began. Pain worse in the morning and the next day after running. Has not run as much in the past 2 yrs due to heel pain. Mileage before pain started was 15-20 mpw.  Using a prostep insole. Has done some rolling and stretching without much relief.   Review of Systems     Objective:   Physical Exam Pleasant, NAD  Seated- has high arch Standing shows Mild collapse of bilat long arches R>L Hypertrophy of 1st MTP joint R>l slight hammering toes 4-5 bilat Flattening of transverse arch on rt Good great toe motion bilat Prominent accessory navicular on rt  Rt Ankle: No visible erythema or swelling. Range of motion is full in all directions. Strength is 5/5 in all directions. Stable lateral and medial ligaments; squeeze test and kleiger test unremarkable; Talar dome nontender; No pain at base of 5th MT; No tenderness over cuboid; No tenderness over N spot or navicular prominence No tenderness on posterior aspects of lateral and medial malleolus No sign of peroneal tendon subluxations; Negative tarsal tunnel tinel's Able to walk 4 steps.  Rt leg 87 cm Lt leg 88.5 cm  MSK [place] There is thickening of PF at heel insertion with small spur on RT RT is 0.56 vs left of 0.40 cms 2 cms distal to heel PF is 0.33 on RT vs normal level of 0.2 No tear noted      Assessment & Plan:

## 2012-08-30 NOTE — Assessment & Plan Note (Signed)
[place] and clinical exam confirm moderate PFasciitis  Use standard stretches and HEP  Arch strap   Icing  OTC meds prn   I would like to recheck in 6 weeks as we would probably use a custom orthotic if no improvement

## 2012-10-03 ENCOUNTER — Encounter: Payer: Self-pay | Admitting: Sports Medicine

## 2012-10-03 ENCOUNTER — Ambulatory Visit (INDEPENDENT_AMBULATORY_CARE_PROVIDER_SITE_OTHER): Payer: Managed Care, Other (non HMO) | Admitting: Sports Medicine

## 2012-10-03 VITALS — BP 128/81 | HR 44 | Ht 68.0 in | Wt 175.0 lb

## 2012-10-03 DIAGNOSIS — M722 Plantar fascial fibromatosis: Secondary | ICD-10-CM

## 2012-10-03 NOTE — Progress Notes (Signed)
Patient ID: ROLEN CONGER, male   DOB: [birth year], 51 y.o.   MRN: [id number]  Patient returns for a followup of right plantar fasciitis  Doing the exercises and stretches he feels that he is made about 50% improvement  He also thinks that the arch straps help him  He is using an over-the-counter sports insert prostep -that also seems to help  He is walking okay but has held off running because it still is painful   Physical examination No acute distress Mild tenderness only at the insertion of the plantar fascia and the medial calcaneus  Exam from last visit noted: Seated- has high arch  Standing shows Mild collapse of bilat long arches R>L  Hypertrophy of 1st MTP joint R>l  slight hammering toes 4-5 bilat  Flattening of transverse arch on rt  Good great toe motion bilat  Prominent accessory navicular on rt  No tenderness over the first MTP joint or over the forefoot

## 2012-10-03 NOTE — Assessment & Plan Note (Signed)
Is making surprisingly good progress considering the length of time he has had plantar fasciitis  I suggested continuing using the inserts and arch straps  Keep up the stretches and exercises  Recheck with me in 2-3 months  He can return to running once he feels that he has had very minimal pain

## 2012-12-27 ENCOUNTER — Encounter: Payer: Self-pay | Admitting: Sports Medicine

## 2012-12-27 ENCOUNTER — Ambulatory Visit (INDEPENDENT_AMBULATORY_CARE_PROVIDER_SITE_OTHER): Payer: BC Managed Care – PPO | Admitting: Sports Medicine

## 2012-12-27 VITALS — BP 132/83 | HR 41 | Ht 68.0 in | Wt 175.0 lb

## 2012-12-27 DIAGNOSIS — M2170 Unequal limb length (acquired), unspecified site: Secondary | ICD-10-CM

## 2012-12-27 DIAGNOSIS — M7740 Metatarsalgia, unspecified foot: Secondary | ICD-10-CM | POA: Insufficient documentation

## 2012-12-27 DIAGNOSIS — M722 Plantar fascial fibromatosis: Secondary | ICD-10-CM

## 2012-12-27 DIAGNOSIS — M7750 Other enthesopathy of unspecified foot: Secondary | ICD-10-CM

## 2012-12-27 NOTE — Progress Notes (Signed)
  Subjective:    Patient ID: Jaime Curtis, male    DOB: [birth year], 53 y.o.   MRN: [id number]  HPI  Patient returns for a followup of right plantar fasciitis. Doing the exercises and stretches he feels that he is made about 75% improvement.  He is using an over-the-counter sports insert prosteps and is happy with these so far. He also has a heel lift in RT for leg length inequality. Complains of mild heel pain right after running short distances, but says this could be due to deconditioning.  Today, he wants to discuss numbness sensation of bilateral toes. Described as "cold" sensation of all toes, worse at fifth toes but all toes are affected. This started about 2-3 months ago after he was last seen at [facility]. Sensation comes and goes, notices it more at night when he takes off shoes/socks and goes to bed.   No hx of DM, non-smoker.  Denies any PMH or FH of autoimmune diseases.  Review of Systems Per HPI    Objective:   Physical Exam General: very pleasant, in no acute distress MSK:  RT foot Exam: Mild tenderness only at the insertion of the plantar fascia and the medial calcaneus.  Full ROM.  Standing shows collapse of bilateral long arches, RT > LT. Slight hammering of toes 2-5 bilaterally.  Flattening of transverse arch on RT.  Accessory navicular bone on RT. Skin: no clubbing or cyanosis are toes bilaterally; strong and equal pedal pulses bilaterally     Assessment & Plan:

## 2012-12-27 NOTE — Assessment & Plan Note (Signed)
Improving after 4 months of HEP and sports insoles.  Patient to continue HEP.  If after 6 months, patient still complains of symptoms, we can consider custom made orthotics.  Follow up in about 8 weeks if needed.

## 2012-12-27 NOTE — Assessment & Plan Note (Signed)
Cont with 3/4 length heel lift to correct gait

## 2012-12-27 NOTE — Assessment & Plan Note (Addendum)
RT metatarsalgia could be secondary to increased pressure on fat pads bilaterally from trying to correct plantar fasciitis.  We placed metatarsal pads to bilateral shoe insoles to see if this relieves pressure from the nerves located in this area.  Recommended daily Vitamin B-6.  Patient to call us in about 4 weeks to discuss progress.

## 2012-12-27 NOTE — Patient Instructions (Addendum)
We placed metatarsal pads on your insoles to keep pressure off the nerves.   Try this for about 4 weeks. Also take Vitamin B-6 once a day. Continue exercises for plantar fasciitis. Call us in about 4 weeks to discuss progress.

## 2013-04-07 ENCOUNTER — Encounter: Payer: Self-pay | Admitting: Internal Medicine

## 2013-04-07 ENCOUNTER — Ambulatory Visit (INDEPENDENT_AMBULATORY_CARE_PROVIDER_SITE_OTHER): Payer: BC Managed Care – PPO | Admitting: Internal Medicine

## 2013-04-07 VITALS — BP 122/72 | HR 41 | Temp 97.5°F | Ht 68.0 in | Wt 180.5 lb

## 2013-04-07 DIAGNOSIS — J309 Allergic rhinitis, unspecified: Secondary | ICD-10-CM

## 2013-04-07 DIAGNOSIS — R209 Unspecified disturbances of skin sensation: Secondary | ICD-10-CM

## 2013-04-07 DIAGNOSIS — L989 Disorder of the skin and subcutaneous tissue, unspecified: Secondary | ICD-10-CM

## 2013-04-07 DIAGNOSIS — R202 Paresthesia of skin: Secondary | ICD-10-CM

## 2013-04-07 NOTE — Progress Notes (Signed)
  Subjective:    Patient ID: TION TSE, male    DOB: October 02, 1961, 52 y.o.   MRN: [id number]  HPI  Here with c/o new lesion to mid left cheek for 2 mo, nontneder but red, now slightly raised and not healing. No trauma, fever, no pain. Also c/o new onset approx 6 mo toe numbness starting the great toes bilat, now involving all toes, but only toes, no feet or other, and no pain or weakness.  No trauma, fever, swelling, redness.  Overall good compliance with treatment, and good medicine tolerability, including the lipitor.  Pt denies chest pain, increased sob or doe, wheezing, orthopnea, PND, increased LE swelling, palpitations, dizziness or syncope.   Pt denies polydipsia, polyuria. Last TSH normal Past Medical History  Diagnosis Date  . ALLERGIC RHINITIS 12/07/2007  . CHEST PAIN 12/07/2007  . DARK URINE 12/07/2007  . HEMATOCHEZIA 10/03/2007  . HYPERLIPIDEMIA 12/07/2007  . NECK PAIN 11/18/2010  . Pure hypercholesterolemia 08/18/2007  . SINUSITIS- ACUTE-NOS 11/25/2009  . URI 11/18/2010  . Bradycardia, sinus 07/10/2011   Past Surgical History  Procedure Laterality Date  . Inguinal herniorrhapy      left  . Tonsillectomy    . Hx of fractured right scapula      reports that he has never smoked. He has never used smokeless tobacco. He reports that  drinks alcohol. He reports that he does not use illicit drugs. family history includes Cancer in his maternal grandmother and Diabetes in his sister. Allergies  Allergen Reactions  . Pseudoephedrine    Current Outpatient Prescriptions on File Prior to Visit  Medication Sig Dispense Refill  . aspirin 81 MG tablet Take 81 mg by mouth daily.        Marland Kitchen atorvastatin (LIPITOR) 20 MG tablet Take 1 tablet (20 mg total) by mouth daily.  90 tablet  3   No current facility-administered medications on file prior to visit.   Review of Systems  Constitutional: Negative for unexpected weight change, or unusual diaphoresis  HENT: Negative for tinnitus.   Eyes:  Negative for photophobia and visual disturbance.  Respiratory: Negative for choking and stridor.   Gastrointestinal: Negative for vomiting and blood in stool.  Genitourinary: Negative for hematuria and decreased urine volume.  Musculoskeletal: Negative for acute joint swelling Skin: Negative for color change and wound.  Neurological: Negative for tremors and numbness other than noted  Psychiatric/Behavioral: Negative for decreased concentration or  hyperactivity.       Objective:   Physical Exam BP 122/72  Pulse 41  Temp(Src) 97.5 F (36.4 C) (Oral)  Ht 5\' 8"  (1.727 m)  Wt 180 lb 8 oz (81.874 kg)  BMI 27.45 kg/m2  SpO2 98% VS noted,  Constitutional: Pt appears well-developed and well-nourished.  HENT: Head: NCAT.  Right Ear: External ear normal.  Left Ear: External ear normal.  Bilat tm's with mild erythema.  Max sinus areas mild tender.  Pharynx with mild erythema, no exudate Eyes: Conjunctivae and EOM are normal. Pupils are equal, round, and reactive to light.  Neck: Normal range of motion. Neck supple.  Cardiovascular: Normal rate and regular rhythm.   Pulmonary/Chest: Effort normal and breath sounds normal.  Neurological: Pt is alert. Not confused , motor 5/5, no numbness to LT today, DTR/gait intact Skin: Skin is warm. No erythema. Left mid cheek with approx 8 mm slightly raised red/purplish lesion nontender Psychiatric: Pt behavior is normal. Thought content normal. not nervous    Assessment & Plan:

## 2013-04-07 NOTE — Patient Instructions (Signed)
Please continue all other medications as before You will be contacted regarding the referral for: dermatology  Please remember to sign up for My Chart if you have not done so, as this will be important to you in the future with finding out test results, communicating by private email, and scheduling acute appointments online when needed.

## 2013-04-08 DIAGNOSIS — R202 Paresthesia of skin: Secondary | ICD-10-CM | POA: Insufficient documentation

## 2013-04-08 NOTE — Assessment & Plan Note (Signed)
For otc allegra prn,  to f/u any worsening symptoms or concerns  

## 2013-04-08 NOTE — Assessment & Plan Note (Signed)
?   Skin ca such as basal cell - for derm referral

## 2013-04-08 NOTE — Assessment & Plan Note (Signed)
Exam benign, to add labs to next visit labs (declines today)

## 2013-08-01 ENCOUNTER — Other Ambulatory Visit (INDEPENDENT_AMBULATORY_CARE_PROVIDER_SITE_OTHER): Payer: BC Managed Care – PPO

## 2013-08-01 DIAGNOSIS — R202 Paresthesia of skin: Secondary | ICD-10-CM

## 2013-08-01 DIAGNOSIS — R209 Unspecified disturbances of skin sensation: Secondary | ICD-10-CM

## 2013-08-01 DIAGNOSIS — Z0000 Encounter for general adult medical examination without abnormal findings: Secondary | ICD-10-CM

## 2013-08-01 LAB — SEDIMENTATION RATE: Sed Rate: 4 mm/hr (ref 0–22)

## 2013-08-01 LAB — CBC WITH DIFFERENTIAL/PLATELET
Basophils Absolute: 0.1 10*3/uL (ref 0.0–0.1)
Eosinophils Absolute: 0.4 10*3/uL (ref 0.0–0.7)
Eosinophils Relative: 5.3 % — ABNORMAL HIGH (ref 0.0–5.0)
MCV: 91.4 fl (ref 78.0–100.0)
Monocytes Absolute: 0.5 10*3/uL (ref 0.1–1.0)
Neutrophils Relative %: 66.8 % (ref 43.0–77.0)
Platelets: 180 10*3/uL (ref 150.0–400.0)
RDW: 13.7 % (ref 11.5–14.6)
WBC: 7.7 10*3/uL (ref 4.5–10.5)

## 2013-08-01 LAB — BASIC METABOLIC PANEL
BUN: 18 mg/dL (ref 6–23)
Calcium: 9.3 mg/dL (ref 8.4–10.5)
Chloride: 106 mEq/L (ref 96–112)
Creatinine, Ser: 1.2 mg/dL (ref 0.4–1.5)
GFR: 67.8 mL/min (ref >60.00)
Potassium: 4.9 mEq/L (ref 3.5–5.1)

## 2013-08-01 LAB — HEPATIC FUNCTION PANEL
ALT: 28 U/L (ref 0–53)
AST: 26 U/L (ref 0–37)
Albumin: 4.1 g/dL (ref 3.5–5.2)
Bilirubin, Direct: 0.1 mg/dL (ref 0.0–0.3)
Total Protein: 6.1 g/dL (ref 6.0–8.3)

## 2013-08-01 LAB — LIPID PANEL
Cholesterol: 150 mg/dL (ref 0–200)
LDL Cholesterol: 82 mg/dL (ref 0–99)
Triglycerides: 132 mg/dL (ref 0.0–149.0)
VLDL: 26.4 mg/dL (ref 0.0–40.0)

## 2013-08-01 LAB — URINALYSIS, ROUTINE W REFLEX MICROSCOPIC
Bilirubin Urine: NEGATIVE
Leukocytes, UA: NEGATIVE
Nitrite: NEGATIVE
Specific Gravity, Urine: 1.01 (ref 1.000–1.030)
pH: 6 (ref 5.0–8.0)

## 2013-08-01 LAB — PSA: PSA: 0.53 ng/mL (ref 0.10–4.00)

## 2013-08-02 LAB — ANA: Anti Nuclear Antibody(ANA): NEGATIVE

## 2013-08-03 LAB — PROTEIN ELECTROPHORESIS, SERUM
Albumin ELP: 66 % (ref 55.8–66.1)
Alpha-2-Globulin: 9.7 % (ref 7.1–11.8)
Beta 2: 2.8 % — ABNORMAL LOW (ref 3.2–6.5)
Beta Globulin: 5.4 % (ref 4.7–7.2)
Total Protein, Serum Electrophoresis: 6.2 g/dL (ref 6.0–8.3)

## 2013-08-11 ENCOUNTER — Ambulatory Visit (INDEPENDENT_AMBULATORY_CARE_PROVIDER_SITE_OTHER): Payer: BC Managed Care – PPO | Admitting: Internal Medicine

## 2013-08-11 ENCOUNTER — Encounter: Payer: Self-pay | Admitting: Internal Medicine

## 2013-08-11 ENCOUNTER — Ambulatory Visit (INDEPENDENT_AMBULATORY_CARE_PROVIDER_SITE_OTHER)
Admission: RE | Admit: 2013-08-11 | Discharge: 2013-08-11 | Disposition: A | Discharge status: Home / Self Care | Payer: BC Managed Care – PPO | Source: Ambulatory Visit | Attending: Internal Medicine | Admitting: Internal Medicine

## 2013-08-11 VITALS — BP 140/72 | HR 43 | Temp 99.0°F | Ht 68.0 in | Wt 182.0 lb

## 2013-08-11 DIAGNOSIS — I10 Essential (primary) hypertension: Secondary | ICD-10-CM | POA: Insufficient documentation

## 2013-08-11 DIAGNOSIS — R079 Chest pain, unspecified: Secondary | ICD-10-CM

## 2013-08-11 DIAGNOSIS — Z0000 Encounter for general adult medical examination without abnormal findings: Secondary | ICD-10-CM

## 2013-08-11 DIAGNOSIS — R202 Paresthesia of skin: Secondary | ICD-10-CM

## 2013-08-11 DIAGNOSIS — R209 Unspecified disturbances of skin sensation: Secondary | ICD-10-CM

## 2013-08-11 DIAGNOSIS — Z800 Family history of malignant neoplasm of digestive organs: Secondary | ICD-10-CM

## 2013-08-11 HISTORY — DX: Family history of malignant neoplasm of digestive organs: Z80.0

## 2013-08-11 MED ORDER — ATORVASTATIN CALCIUM 20 MG PO TABS: 20.0000 mg | ORAL_TABLET | Freq: Every day | ORAL | Status: DC

## 2013-08-11 MED ORDER — IRBESARTAN 75 MG PO TABS: 75.0000 mg | ORAL_TABLET | Freq: Every day | ORAL | Status: DC

## 2013-08-11 NOTE — Assessment & Plan Note (Signed)
?   msk vs other, for cxr

## 2013-08-11 NOTE — Patient Instructions (Addendum)
Please take all new medication as prescribed - the low dose blood pressure medication Please check your Blood Pressure on a regular basis, with the goal being less than 140/90 Please continue all other medications as before, and refills have been done Please have the pharmacy call with any other refills you may need. Please continue your efforts at being more active, low cholesterol diet, and weight control.  You will be contacted regarding the referral for: colonoscopy, due to your family history  Please go to the XRAY Department in the Basement (go straight as you get off the elevator) for the x-ray testing  You will be contacted by phone if any changes need to be made immediately.  Otherwise, you will receive a letter about your results with an explanation, but please check with MyChart first.  Please call if your tingling is worse, such as worsening numbness or pain or difficulty walking, to consider a nerve test for the lower extremities  Please return in 6 months, or sooner if needed (or 1 year if you feel your blood pressure at home is improved)

## 2013-08-11 NOTE — Assessment & Plan Note (Signed)
Mother with recent dz, pt then due for f/u colonscopy at this time

## 2013-08-11 NOTE — Assessment & Plan Note (Signed)
Tingling to feet, exam benign, cont to follow, consider emg/ncs

## 2013-08-11 NOTE — Progress Notes (Signed)
Subjective:    Patient ID: Jaime Curtis, male    DOB: 08/16/1961, 52 y.o.   MRN: [id number]  HPI  Here for wellness and f/u;  Overall doing ok;  Pt denies  worsening SOB, DOE, wheezing, orthopnea, PND, worsening LE edema, palpitations, dizziness or syncope, though has had 1 mo recurring left mid upper pleuritic sharp mild pain without other assoc symptoms.  May have had some left axillary LN's enlarged and tender, then resolved.   Pt denies neurological change such as new headache, facial or extremity weakness, but mentions again persistent mild tingling to the pain, would not really call it numbness, pain, and no weakness.  Pt denies polydipsia, polyuria, or low sugar symptoms. Pt states overall good compliance with treatment and medications, good tolerability, and has been trying to follow lower cholesterol diet.  Pt denies worsening depressive symptoms, suicidal ideation or panic. No fever, night sweats, wt loss, loss of appetite, or other constitutional symptoms.  Pt states good ability with ADL's, has low fall risk, home safety reviewed and adequate, no other significant changes in hearing or vision, and is active with daily cardio exercise.  Has lost 3 lbs from last yr.  Check BP at home and this year has been fairly consistently in the 145-150 range often, o/w asympt.    Mother with finding colon cancer recent, only required surgury. Past Medical History  Diagnosis Date  . ALLERGIC RHINITIS 12/07/2007  . CHEST PAIN 12/07/2007  . DARK URINE 12/07/2007  . HEMATOCHEZIA 10/03/2007  . HYPERLIPIDEMIA 12/07/2007  . NECK PAIN 11/18/2010  . Pure hypercholesterolemia 08/18/2007  . SINUSITIS- ACUTE-NOS 11/25/2009  . URI 11/18/2010  . Bradycardia, sinus 07/10/2011  . Family history of colon cancer 08/11/2013    mother   Past Surgical History  Procedure Laterality Date  . Inguinal herniorrhapy      left  . Tonsillectomy    . Hx of fractured right scapula      reports that he has never smoked. He has  never used smokeless tobacco. He reports that he drinks alcohol. He reports that he does not use illicit drugs. family history includes Cancer in his maternal grandmother; Diabetes in his sister. Allergies  Allergen Reactions  . Pseudoephedrine    Current Outpatient Prescriptions on File Prior to Visit  Medication Sig Dispense Refill  . aspirin 81 MG tablet Take 81 mg by mouth daily.         No current facility-administered medications on file prior to visit.     Review of Systems Constitutional: Negative for diaphoresis, activity change, appetite change or unexpected weight change.  HENT: Negative for hearing loss, ear pain, facial swelling, mouth sores and neck stiffness.   Eyes: Negative for pain, redness and visual disturbance.  Respiratory: Negative for shortness of breath and wheezing.   Cardiovascular: Negative for chest pain and palpitations.  Gastrointestinal: Negative for diarrhea, blood in stool, abdominal distention or other pain Genitourinary: Negative for hematuria, flank pain or change in urine volume.  Musculoskeletal: Negative for myalgias and joint swelling.  Skin: Negative for color change and wound.  Neurological: Negative for syncope and numbness. other than noted Hematological: Negative for adenopathy.  Psychiatric/Behavioral: Negative for hallucinations, self-injury, decreased concentration and agitation.      Objective:   Physical Exam BP 140/72  Pulse 43  Temp(Src) 99 F (37.2 C) (Oral)  Ht 5\' 8"  (1.727 m)  Wt 182 lb (82.555 kg)  BMI 27.68 kg/m2  SpO2 97% VS noted,  Constitutional: Pt  is oriented to person, place, and time. Appears well-developed and well-nourished.  Head: Normocephalic and atraumatic.  Right Ear: External ear normal.  Left Ear: External ear normal.  Nose: Nose normal.  Mouth/Throat: Oropharynx is clear and moist.  Eyes: Conjunctivae and EOM are normal. Pupils are equal, round, and reactive to light.  Neck: Normal range of  motion. Neck supple. No JVD present. No tracheal deviation present.  Cardiovascular: Normal rate, regular rhythm, normal heart sounds and intact distal pulses.   Pulmonary/Chest: Effort normal and breath sounds normal.  Abdominal: Soft. Bowel sounds are normal. There is no tenderness. No HSM  Musculoskeletal: Normal range of motion. Exhibits no edema.  Lymphadenopathy:  Has no cervical adenopathy.  Neurological: Pt is alert and oriented to person, place, and time. Pt has normal reflexes. No cranial nerve deficit.  Skin: Skin is warm and dry. No rash noted.  Psychiatric:  Has  normal mood and affect. Behavior is normal.     Assessment & Plan:

## 2013-08-11 NOTE — Assessment & Plan Note (Signed)

## 2013-08-11 NOTE — Assessment & Plan Note (Signed)
New onset, mild, to cont efforts at wt control, low salt diet, also for avapro 75 qd, f/u with BP at home, call in 2-4 wks if no change for consideration increase to 150 mg

## 2013-09-05 ENCOUNTER — Telehealth: Payer: Self-pay | Admitting: Gastroenterology

## 2013-09-05 NOTE — Telephone Encounter (Signed)
Pt has been given the recommendations from the guide lines the pt mother was 90 at diagnosis

## 2013-09-05 NOTE — Telephone Encounter (Signed)
Pt will call with any concerns or symptoms

## 2013-09-05 NOTE — Telephone Encounter (Signed)
How old is his mother at time of colon cancer diagnosis?  Guidelines show it only increases risk if 1st degree relative (such as his mother) is diagnosed younger than 93-51 years old.

## 2013-09-05 NOTE — Telephone Encounter (Signed)
Dr Jacobs please review  

## 2013-10-24 ENCOUNTER — Ambulatory Visit (INDEPENDENT_AMBULATORY_CARE_PROVIDER_SITE_OTHER): Payer: BC Managed Care – PPO | Admitting: Sports Medicine

## 2013-10-24 ENCOUNTER — Encounter: Payer: Self-pay | Admitting: Sports Medicine

## 2013-10-24 VITALS — BP 117/73 | HR 38 | Ht 68.0 in | Wt 182.0 lb

## 2013-10-24 DIAGNOSIS — M722 Plantar fascial fibromatosis: Secondary | ICD-10-CM

## 2013-10-24 DIAGNOSIS — R269 Unspecified abnormalities of gait and mobility: Secondary | ICD-10-CM | POA: Insufficient documentation

## 2013-10-24 NOTE — Progress Notes (Signed)
Patient ID: Christene SlatesSteven G Thiem, male   DOB: 10/26/1960, 53 y.o.   MRN: [id number]  Runner with hx of PF Now improving with little pain However he has lost his long arch Using an OTC orthotic but does not feel enough support Would like to get into more running Comes for consideration of custom orthotics  Exam NAD BP 117/73  Pulse 38  Ht 5\' 8"  (1.727 m)  Wt 182 lb (82.555 kg)  BMI 27.68 kg/m2  Feet show loss of long arch No real TTP at PF Food great toe motion  Gait is pronated Rt leg is 1 cm shorter

## 2013-10-24 NOTE — Assessment & Plan Note (Signed)
This has improved with much less pain  Keep up stretches  Orthotics should help

## 2013-10-24 NOTE — Assessment & Plan Note (Signed)
Patient was fitted for a : standard, cushioned, semi-rigid orthotic. The orthotic was heated and afterward the patient stood on the orthotic blank positioned on the orthotic stand. The patient was positioned in subtalar neutral position and 10 degrees of ankle dorsiflexion in a weight bearing stance. After completion of molding, a stable base was applied to the orthotic blank. The blank was ground to a stable position for weight bearing. Size:11 red EVA Base: blue EVA Posting: RT foam lift Additional orthotic padding:O  Gait after completion was neutral and felt comfortable  Try these but if helpful will consider making for work shoes as well  Face to face time 45 mins

## 2014-02-02 ENCOUNTER — Encounter (INDEPENDENT_AMBULATORY_CARE_PROVIDER_SITE_OTHER): Payer: BC Managed Care – PPO

## 2014-02-02 ENCOUNTER — Encounter: Payer: Self-pay | Admitting: Radiology

## 2014-02-02 ENCOUNTER — Ambulatory Visit (INDEPENDENT_AMBULATORY_CARE_PROVIDER_SITE_OTHER): Payer: BC Managed Care – PPO | Admitting: Family Medicine

## 2014-02-02 ENCOUNTER — Telehealth: Payer: Self-pay | Admitting: Internal Medicine

## 2014-02-02 VITALS — BP 110/75 | HR 48 | Temp 97.4°F | Resp 18 | Ht 67.5 in | Wt 182.0 lb

## 2014-02-02 DIAGNOSIS — R42 Dizziness and giddiness: Secondary | ICD-10-CM

## 2014-02-02 DIAGNOSIS — R001 Bradycardia, unspecified: Secondary | ICD-10-CM

## 2014-02-02 DIAGNOSIS — J029 Acute pharyngitis, unspecified: Secondary | ICD-10-CM

## 2014-02-02 DIAGNOSIS — I498 Other specified cardiac arrhythmias: Secondary | ICD-10-CM

## 2014-02-02 LAB — POCT RAPID STREP A (OFFICE): Rapid Strep A Screen: NEGATIVE

## 2014-02-02 NOTE — Patient Instructions (Signed)
Any recurrent symptoms and you should return or go to the emergency room  Holter monitor is being scheduled for you

## 2014-02-02 NOTE — Progress Notes (Signed)
Subjective: Patient was [place] [employer] with his son this morning. He was walking with his son and noticed that his balance wasn't right. He had trouble with with his gait mostly. He was not dizzy. No upper extremity problems. He just felt like he was wobbly. His wife who was with him did not notice this, though he was noted by her to be a little anxious looking. Later he she asked what was the matter. They went to lunch and he said I'm just not feeling good. She brought in here. 3 days ago he had a bone anchor hearing device placed in his right posterior scalp by Dr. Tia MaskerKrouse. They did not contact him. Symptoms have improved now. History of hypertension and hyperlipidemia for which she is on medication.  In further questioning the patient said he felt a little lightheaded may be, felt like his feet did not hit well on the irregular ground, but never anything that made him sit down or lay down. At lunchtime he reclined a little and felt better gradually. He has a history of bradycardia for many years. Never symptomatic. Review of systems today was really otherwise unremarkable. Sore throat.  Objective: Healthy-appearing man in no acute distress. Throat is erythematous with some edema of the uvula. It has been swollen the last couple of days, having a general anesthesia a few days ago. TMs normal. Eyes PERRLA. Fundi benign. He has a plug in the skin of the skull about 3 inches behind the right ear. No carotid bruits. Chest clear. Heart bradycardic without murmur. A couple of possible mildly ectopic beats. Abdomen soft. Extremities normal. Finger to nose normal. Tandem walk normal. Romberg negative.    Results for orders placed in visit on 02/02/14  POCT RAPID STREP A (OFFICE)      Result Value Ref Range   Rapid Strep A Screen Negative  Negative    assessment: Lightheadedness with mild gait disturbance, resolved Resting bradycardia Pharyngitis secondary to recent anesthesia  Plan: Throat culture  will be done  EKG--bradycardia heart rate 35. Very similar pattern to previous EKGs except for minimal ST-T-wave flattening in V2.  Attempted to did not come back. Dr. Tia MaskerKrouse, unable to reach him.left message to call me back on my cell phone.   Will [facility] with [facility](where his wife is a Engineer, civil (consulting)nurse). According to her 1935-57 is common for him at rest, has never seen a cardiologist for evaluation. This has been a conversation at home for some time apparently.   [facility]. She is not concerned about the appearance of the EKG. Patient is having on over to the office to get a Holter monitor put on him this evening. Cautioned him to go to the emergency room or come here if any more events. Recommended to see a cardiologist sometime for further workup, which I believe his wife will schedule for him at the office.

## 2014-02-02 NOTE — Telephone Encounter (Signed)
New Message:  Dr. Frederik PearHopper's office is requesting Dr. Tenny Crawoss review an EKG. Stanton Kidneyebra, RN states she will call their office back.   [phone] Revonda Standard- Allison at Dr. Frederik PearHopper's office

## 2014-02-02 NOTE — Progress Notes (Signed)
Patient ID: Jaime Curtis, male   DOB: 03-01-1961, 53 y.o.   MRN: [id number] E cardio 24hr holter applied

## 2014-02-02 NOTE — Telephone Encounter (Signed)
Dr Tenny Crawross reviewed EKG. Pt to come by the office for holter monitor

## 2014-02-04 LAB — CULTURE, GROUP A STREP: Organism ID, Bacteria: NORMAL

## 2014-03-12 ENCOUNTER — Ambulatory Visit: Payer: BC Managed Care – PPO | Admitting: Physician Assistant

## 2014-07-27 ENCOUNTER — Other Ambulatory Visit: Payer: Self-pay

## 2014-08-19 ENCOUNTER — Other Ambulatory Visit: Payer: Self-pay | Admitting: Internal Medicine

## 2014-08-29 ENCOUNTER — Other Ambulatory Visit: Payer: Self-pay | Admitting: Internal Medicine

## 2014-09-27 ENCOUNTER — Ambulatory Visit (INDEPENDENT_AMBULATORY_CARE_PROVIDER_SITE_OTHER): Payer: BC Managed Care – PPO | Admitting: Family

## 2014-09-27 ENCOUNTER — Encounter: Payer: Self-pay | Admitting: Family

## 2014-09-27 VITALS — BP 134/88 | HR 49 | Temp 98.3°F | Resp 18 | Ht 68.0 in | Wt 179.1 lb

## 2014-09-27 DIAGNOSIS — J029 Acute pharyngitis, unspecified: Secondary | ICD-10-CM

## 2014-09-27 MED ORDER — AZITHROMYCIN 250 MG PO TABS: ORAL_TABLET | ORAL | Status: DC

## 2014-09-27 NOTE — Progress Notes (Signed)
Pre visit review using our clinic review tool, if applicable. No additional management support is needed unless otherwise documented below in the visit note. 

## 2014-09-27 NOTE — Progress Notes (Signed)
   Subjective:    Patient ID: Jaime Curtis, male    DOB: [birth year], 53 y.o.   MRN: [id number]  Chief Complaint  Patient presents with  . Sore Throat    x3 weeks, has had a sore throat no other symptoms, started off having trouble swallowing but now its more a "strange feeling"    HPI:  Jaime Curtis is a 53 y.o. male who presents today for an acute visit.   Acute symptoms of sore throat started about 3 weeks ago. Indicates that the soreness has gone away but now feels like there is something in the back of his throat. Denies any associated cough or fever. States that he may have had a little bit of congestion. Denies any heartburn or tightness in his chest. Denies having tried any treatments. Over the course of the 3 weeks it has gotten a little bit better. States that it waxes and wanes, sometimes feeling a little better and sometimes worse. Denies any difficulty breathing or shortness of breath.  Allergies  Allergen Reactions  . Pseudoephedrine     Current Outpatient Prescriptions on File Prior to Visit  Medication Sig Dispense Refill  . aspirin 81 MG tablet Take 81 mg by mouth daily.      Marland Kitchen. atorvastatin (LIPITOR) 20 MG tablet TAKE 1 TABLET DAILY 90 tablet 3  . cefPROZIL (CEFZIL) 500 MG tablet Take 500 mg by mouth 2 (two) times daily.    . irbesartan (AVAPRO) 75 MG tablet TAKE 1 TABLET (75 MG TOTAL) BY MOUTH DAILY. 90 tablet 0  . mupirocin ointment (BACTROBAN) 2 % Place 1 application into the nose 2 (two) times daily.     No current facility-administered medications on file prior to visit.    Review of Systems    See HPI   Objective:    BP 134/88 mmHg  Pulse 49  Temp(Src) 98.3 F (36.8 C) (Oral)  Resp 18  Ht 5\' 8"  (1.727 m)  Wt 179 lb 1.9 oz (81.248 kg)  BMI 27.24 kg/m2  SpO2 97% Nursing note and vital signs reviewed.  Physical Exam  Constitutional: He is oriented to person, place, and time. He appears well-developed and well-nourished. No distress.  HENT:    Right Ear: Hearing, tympanic membrane, external ear and ear canal normal.  Left Ear: Hearing, tympanic membrane, external ear and ear canal normal.  Nose: Nose normal.  Mouth/Throat: Uvula is midline. Posterior oropharyngeal erythema present. No oropharyngeal exudate.  Cardiovascular: Normal rate, regular rhythm, normal heart sounds and intact distal pulses.   Pulmonary/Chest: Effort normal and breath sounds normal.  Neurological: He is alert and oriented to person, place, and time.  Skin: Skin is warm and dry.  Psychiatric: He has a normal mood and affect. His behavior is normal. Judgment and thought content normal.       Assessment & Plan:

## 2014-09-27 NOTE — Assessment & Plan Note (Signed)
Question bacterial pharyngitis versus potential GERD. Start azithromycin. May trial over-the-counter and acid to determine effectiveness. Given no lumps or masses will defer imaging at this point. If problem continues to persist will consider referral to ENT

## 2014-09-27 NOTE — Patient Instructions (Signed)
Thank you for choosing [facility].  Summary/Instructions:  Your prescription(s) have been submitted to your pharmacy. Please take as directed and contact our office if you believe you are having problem(s) with the medication(s).  If your symptoms worsen or fail to improve, please contact our office for further instruction, or in case of emergency go directly to the emergency room at the closest medical facility.      

## 2014-10-09 ENCOUNTER — Telehealth: Payer: Self-pay | Admitting: Internal Medicine

## 2014-10-09 NOTE — Telephone Encounter (Signed)
I [facility] who was treating provider

## 2014-10-09 NOTE — Telephone Encounter (Signed)
Patient has finished zpak but still having diarrhea.  Would like a call back in regards.

## 2014-10-09 NOTE — Telephone Encounter (Signed)
Please call patient to determine other signs and symptoms if he continues to have any. If it is diarrhea alone, he can try OTC immodium as needed. If that does not work than we can do further work up. If there are other significant signs/symptoms please document and reply.

## 2014-10-09 NOTE — Telephone Encounter (Signed)
No other symptoms just the diarrhea. Recommended him trying the immodium first and if that doesn't help for him to give us a call. Pt understood.

## 2014-10-12 ENCOUNTER — Emergency Department (HOSPITAL_COMMUNITY)
Admission: EM | Admit: 2014-10-12 | Discharge: 2014-10-12 | Disposition: A | Discharge status: Home / Self Care | Payer: BC Managed Care – PPO | Source: Home / Self Care | Attending: Emergency Medicine | Admitting: Emergency Medicine

## 2014-10-12 ENCOUNTER — Encounter (HOSPITAL_COMMUNITY): Payer: Self-pay | Admitting: Emergency Medicine

## 2014-10-12 DIAGNOSIS — K529 Noninfective gastroenteritis and colitis, unspecified: Secondary | ICD-10-CM

## 2014-10-12 LAB — CLOSTRIDIUM DIFFICILE BY PCR: Toxigenic C. Difficile by PCR: NEGATIVE

## 2014-10-12 MED ORDER — METRONIDAZOLE 500 MG PO TABS: 500.0000 mg | ORAL_TABLET | Freq: Three times a day (TID) | ORAL | Status: DC

## 2014-10-12 NOTE — Progress Notes (Signed)
Quick Note:  Test result was normal. No further action is needed at this time. The patient was called and informed of this normal result. Results of the stool culture still pending. I would like him to continue on his metronidazole for now, until we get results back. This could be a false negative, since stool specimens are notoriously inaccurate. ______

## 2014-10-12 NOTE — ED Provider Notes (Signed)
   Chief Complaint   Abdominal Pain    History of Present Illness   Jaime Curtis is a 54 year old male who has had a ten-day history of diarrhea. This began with a sore throat. He was given a Z-Pak. A day later he developed diarrhea. He's having 4-6 loose stools a day without blood or mucus. Or the past 2 days he's had some right flank pain. He's also had headaches and fatigue. He denies fever, chills, nasal congestion, rhinorrhea, or cough. No nausea or vomiting. No foreign travel, suspicious ingestions, or animal exposure.  Review of Systems   Other than as noted above, the patient denies any of the following symptoms: Systemic:  No fevers, chills, or dizziness. GI:  Blood in stool or vomitus. GU:  No dysuria, frequency, or urgency.  PMFSH   Past medical history, family history, social history, meds, and allergies were reviewed.  He takes Avapro and Lipitor for hypertension and hypercholesterolemia respectively. He's intolerant to pseudoephedrine.  Physical Exam     Vital signs:  BP 132/78 mmHg  Pulse 50  Temp(Src) 98.1 F (36.7 C) (Oral)  Resp 18  SpO2 98% No data found.  General:  Alert and oriented.  In no distress.  Skin warm and dry.  Good skin turgor, brisk capillary refill. ENT:  No scleral icterus, moist mucous membranes, no oral lesions, pharynx clear. Lungs:  Breath sounds clear and equal bilaterally.  No wheezes, rales, or rhonchi. Heart:  Rhythm regular, without extrasystoles.  No gallops or murmers. Abdomen:  Soft, flat, nondistended. No organomegaly or mass. Bowel sounds are normally active. No tenderness, guarding, or rebound. Skin: Clear, warm, and dry.  Good turgor.  Brisk capillary refill.  Labs    Stool culture and Clostridium difficile were obtained.  Assessment   The encounter diagnosis was Gastroenteritis.  Most likely due to Clostridium difficile, since this began the day after he took a course of Z-Pak. Also possibly could be bacterial or  inflammatory bowel disease.  Plan   1.  Meds:  The following meds were prescribed:   New Prescriptions   METRONIDAZOLE (FLAGYL) 500 MG TABLET    Take 1 tablet (500 mg total) by mouth 3 (three) times daily.    2.  Patient Education/Counseling:  The patient was given appropriate handouts, self care instructions, and instructed in symptomatic relief. Suggested he take infectious precautions. Eat a light diet.  Suggested he take a probiotic.  3.  Follow up:  The patient was told to follow up here or with his primary care physician if no better in 10 days, or sooner if becoming worse in any way, and given some red flag symptoms such as persistent vomitng, high fever, severe abdominal pain, or any GI bleeding which would prompt immediate return.        Reuben Likes, MD 10/12/14 [phone]

## 2014-10-12 NOTE — Discharge Instructions (Signed)
Clostridium Difficile Infection °Clostridium difficile (C. difficile) is a bacteria found in the intestinal tract or colon. Under certain conditions, it causes diarrhea and sometimes severe disease. The severe form of the disease is known as pseudomembranous colitis (often called C. difficile colitis). This disease can damage the lining of the colon or cause the colon to become enlarged (toxic megacolon). °CAUSES °Your colon normally contains many different bacteria, including C. difficile. The balance of bacteria in your colon can change during illness. This is especially true when you take antibiotic medicine. Taking antibiotics may allow the C. difficile to grow, multiply excessively, and make a toxin that then causes illness. The elderly and people with certain medical conditions have a greater risk of getting C. difficile infections. °SYMPTOMS °· Watery diarrhea. °· Fever. °· Fatigue. °· Loss of appetite. °· Nausea. °· Abdominal swelling, pain, or tenderness. °· Dehydration. °DIAGNOSIS °Your symptoms may make your caregiver suspect a C. difficile infection, especially if you have used antibiotics in the preceding weeks. However, there are only 2 ways to know for certain whether you have a C. difficile infection: °· A lab test that finds the toxin in your stool. °· The specific appearance of an abnormality (pseudomembrane) in your colon. This can only be seen by doing a sigmoidoscopy or colonoscopy. These procedures involve passing an instrument through your rectum to look at the inside of your colon. °Your caregiver will help determine if these tests are necessary. °TREATMENT °· Most people are successfully treated with one of two specific antibiotics, usually given by mouth. Other antibiotics you are receiving are stopped if possible. °· Intravenous (IV) fluids and correction of electrolyte imbalance may be necessary. °· Rarely, surgery may be needed to remove the infected part of the intestines. °· Careful  hand washing by you and your caregivers is important to prevent the spread of infection. In the hospital, your caregivers may also put on gowns and gloves to prevent the spread of the C. difficile bacteria. Your room is also cleaned regularly with a solution containing bleach or a product that is known to kill C. difficile. °HOME CARE INSTRUCTIONS °· Drink enough fluids to keep your urine clear or pale yellow. Avoid milk, caffeine, and alcohol. °· Ask your caregiver for specific rehydration instructions. °· Try eating small, frequent meals rather than large meals. °· Take your antibiotics as directed. Finish them even if you start to feel better. °· Do not use medicines to slow diarrhea. This could delay healing or cause complications. °· Wash your hands thoroughly after using the bathroom and before preparing food. °· Make sure people who live with you wash their hands often, too. °· Carefully disinfect all surfaces with a product that contains chlorine bleach. °SEEK MEDICAL CARE IF: °· Diarrhea persists longer than expected or recurs after completing your course of antibiotic treatment for the C. difficile infection. °· You have trouble staying hydrated. °SEEK IMMEDIATE MEDICAL CARE IF: °· You develop a new fever. °· You have increasing abdominal pain or tenderness. °· There is blood in your stools, or your stools are dark black and tarry. °· You cannot hold down food or liquids. °MAKE SURE YOU: °· Understand these instructions. °· Will watch your condition. °· Will get help right away if you are not doing well or get worse. °Document Released: 07/08/2005 Document Revised: 02/12/2014 Document Reviewed: 03/06/2011 °[employer]® Patient Information ©2015 [employer]. This information is not intended to replace advice given to you by your health care provider. Make sure you   discuss any questions you have with your health care provider.   To restore the normal balance of "good bacteria" in your system.  Take a  probiotic once daily.  These can be gotten over the counter at the drug store without a prescription and come under various brand names such as Culturelle, Align, Florastore, and [employer].  The best thing to do is to ask your pharmacist to recommend a good probiotic that is not too expensive.

## 2014-10-12 NOTE — ED Notes (Signed)
C/o abd pain and diarrhea onset 3 days Reports he was placed on z-pack on 12/17 for a ST Sx today also include: HA, fatigue Denies weakness, bloody stools Alert, no signs of acute distress.

## 2014-10-15 ENCOUNTER — Encounter: Payer: Self-pay | Admitting: Nurse Practitioner

## 2014-10-15 ENCOUNTER — Ambulatory Visit (INDEPENDENT_AMBULATORY_CARE_PROVIDER_SITE_OTHER): Payer: BC Managed Care – PPO | Admitting: Nurse Practitioner

## 2014-10-15 ENCOUNTER — Other Ambulatory Visit (INDEPENDENT_AMBULATORY_CARE_PROVIDER_SITE_OTHER): Payer: BC Managed Care – PPO

## 2014-10-15 ENCOUNTER — Telehealth: Payer: Self-pay

## 2014-10-15 VITALS — BP 110/60 | HR 41 | Temp 98.1°F | Ht 68.0 in | Wt 181.0 lb

## 2014-10-15 DIAGNOSIS — R197 Diarrhea, unspecified: Secondary | ICD-10-CM

## 2014-10-15 LAB — URINALYSIS, ROUTINE W REFLEX MICROSCOPIC
BILIRUBIN URINE: NEGATIVE
Hgb urine dipstick: NEGATIVE
KETONES UR: NEGATIVE
Nitrite: NEGATIVE
SPECIFIC GRAVITY, URINE: 1.01 (ref 1.000–1.030)
Total Protein, Urine: NEGATIVE
Urine Glucose: NEGATIVE
Urobilinogen, UA: 0.2 (ref 0.0–1.0)
pH: 6 (ref 5.0–8.0)

## 2014-10-15 LAB — CBC WITH DIFFERENTIAL/PLATELET
BASOS PCT: 0.8 % (ref 0.0–3.0)
Basophils Absolute: 0 10*3/uL (ref 0.0–0.1)
Eosinophils Absolute: 0.2 10*3/uL (ref 0.0–0.7)
Eosinophils Relative: 3.3 % (ref 0.0–5.0)
HCT: 44.2 % (ref 39.0–52.0)
Hemoglobin: 14.8 g/dL (ref 13.0–17.0)
LYMPHS ABS: 1.2 10*3/uL (ref 0.7–4.0)
LYMPHS PCT: 21.2 % (ref 12.0–46.0)
MCHC: 33.5 g/dL (ref 30.0–36.0)
MCV: 92 fl (ref 78.0–100.0)
MONOS PCT: 7.8 % (ref 3.0–12.0)
Monocytes Absolute: 0.4 10*3/uL (ref 0.1–1.0)
NEUTROS ABS: 3.6 10*3/uL (ref 1.4–7.7)
NEUTROS PCT: 66.9 % (ref 43.0–77.0)
Platelets: 209 10*3/uL (ref 150.0–400.0)
RBC: 4.8 Mil/uL (ref 4.22–5.81)
RDW: 13.2 % (ref 11.5–15.5)
WBC: 5.4 10*3/uL (ref 4.0–10.5)

## 2014-10-15 LAB — COMPREHENSIVE METABOLIC PANEL
ALBUMIN: 3.8 g/dL (ref 3.5–5.2)
ALT: 18 U/L (ref 0–53)
AST: 25 U/L (ref 0–37)
Alkaline Phosphatase: 41 U/L (ref 39–117)
BUN: 13 mg/dL (ref 6–23)
CO2: 28 mEq/L (ref 19–32)
Calcium: 8.7 mg/dL (ref 8.4–10.5)
Chloride: 109 mEq/L (ref 96–112)
Creatinine, Ser: 0.9 mg/dL (ref 0.4–1.5)
GFR: 91.7 mL/min (ref >60.00)
GLUCOSE: 80 mg/dL (ref 70–99)
Potassium: 3.5 mEq/L (ref 3.5–5.1)
Sodium: 142 mEq/L (ref 135–145)
Total Bilirubin: 0.5 mg/dL (ref 0.2–1.2)
Total Protein: 5.9 g/dL — ABNORMAL LOW (ref 6.0–8.3)

## 2014-10-15 LAB — SEDIMENTATION RATE: Sed Rate: 6 mm/hr (ref 0–22)

## 2014-10-15 NOTE — Patient Instructions (Signed)
Continue metronidazole.  Start probiotic-take daily, 2 hours after taking metronidazole. Align & culturelle are good brands.  Eat 1 cup apple sauce daily. Avoid apple juice.  Electrolyte loss is less likely when you do not have vomiting & and are eating & drinking normal diet. You should drink 8 oz extra fluid with each watery stool to avoid dehydration.  This office will call with lab results and any recommended change in treatment.

## 2014-10-15 NOTE — Progress Notes (Signed)
Pre visit review using our clinic review tool, if applicable. No additional management support is needed unless otherwise documented below in the visit note. 

## 2014-10-15 NOTE — Progress Notes (Deleted)
   Subjective:    Patient ID: Jaime Curtis, male    DOB: [birth year], 54 y.o.   MRN: [id number]  Diarrhea       Review of Systems  Gastrointestinal: Positive for diarrhea.       Objective:   Physical Exam        Assessment & Plan:

## 2014-10-15 NOTE — Progress Notes (Signed)
Subjective:     Jaime Curtis is a 54 y.o. male who presents for evaluation of diarrhea.He is accompanied by his wife. Onset of diarrhea was 2 weeks ago. Diarrhea is occurring approximately 4 times per day. Patient describes diarrhea as watery. It does not waken him. Diarrhea has been associated with recent antibiotic therapy azithromycin started 12/17 for sore throat, foul odor w/pus, & transient fatigue. Patient denies blood in stool, fever, significant abdominal pain. He was having mild RUQ pain for 2 weeks that resolved since starting metronidazole. Previous visits for diarrhea: yes, last seen 3 days ago by Urgent Care. Evaluation to date: stool cultures-pending and c diff PCR-neg. Treatment to date: metronidazole started 3 da.  The following portions of the patient's history were reviewed and updated as appropriate: allergies, current medications, past medical history, past social history, past surgical history and problem list.  Review of Systems Constitutional: negative for fevers Gastrointestinal: negative for nausea, reflux symptoms and vomiting Genitourinary:negative for dysuria, frequency, hematuria and hesitancy Integument/breast: negative for rash    Objective:    BP 110/60 mmHg  Pulse 41  Temp(Src) 98.1 F (36.7 C) (Oral)  Ht  (1.727 m)  Wt 181 lb (82.101 kg)  BMI 27.53 kg/m2  SpO2 98% General: alert, cooperative, appears stated age and no distress  Hydration:  well hydrated  Abdomen: Lungs: Heart:    soft, non-tender; bowel sounds normal; no masses,  no organomegaly  Clear BS Bradycardia, reg rhythm, no murmur    Assessment: plan     1. Watery diarrhea DD: infectious, diverticulitis, partial obstruction  - CBC with Differential - Comprehensive metabolic panel - Sedimentation rate - Fecal lactoferrin - Ova and parasite examination - POCT urinalysis dipstick - Urine culture  See pt instructions. F/u 10 days

## 2014-10-15 NOTE — Telephone Encounter (Signed)
Lab called and needed the lab orders entered today to be placed in Future Order.

## 2014-10-16 LAB — OVA AND PARASITE EXAMINATION: OP: NONE SEEN

## 2014-10-16 LAB — FECAL LACTOFERRIN, QUANT: LACTOFERRIN: POSITIVE

## 2014-10-16 LAB — STOOL CULTURE: Special Requests: NORMAL

## 2014-10-16 LAB — URINE CULTURE
Colony Count: NO GROWTH
Organism ID, Bacteria: NO GROWTH

## 2014-10-17 ENCOUNTER — Telehealth: Payer: Self-pay | Admitting: Internal Medicine

## 2014-10-17 NOTE — Telephone Encounter (Signed)
Patient notified

## 2014-10-17 NOTE — Telephone Encounter (Signed)
Patient notified of results. Patient saw his UA results on mychart and wanted to know if the highlighted results are significant?

## 2014-10-17 NOTE — Telephone Encounter (Signed)
Discussed w/Dr Jonny RuizJohn. He is not concerned & will eval pt at f/u appt.

## 2014-10-17 NOTE — Telephone Encounter (Signed)
pls call pt: Advise Stool culture has no growth. Ova & parasite test is negative, but I have added a giardia test, which is a parasitic infection. He will be called when this comes back. If he has giardiasis, metronidazole is the appropriate treatment. Lactoferrin is a test that helps determine inflammatory response in the bowel. As infection clears, this level will normalize.

## 2014-10-17 NOTE — Telephone Encounter (Signed)
Pt calling regarding recent lab results on mychart, he has questions on results and medications.

## 2014-10-23 ENCOUNTER — Telehealth: Payer: Self-pay | Admitting: Internal Medicine

## 2014-10-23 DIAGNOSIS — R197 Diarrhea, unspecified: Secondary | ICD-10-CM

## 2014-10-23 NOTE — Telephone Encounter (Signed)
Pt called in and was in office on Friday with diarrhea.  He said this has been a on going issue for a few months now an it is not getting any better.  Would like to see if you can put in a referral to see GI ??

## 2014-10-23 NOTE — Telephone Encounter (Signed)
Referral done

## 2014-10-24 ENCOUNTER — Encounter: Payer: Self-pay | Admitting: *Deleted

## 2014-10-24 NOTE — Telephone Encounter (Signed)
Called and left detailed message for patient informing him that referral to GI was placed and someone from our office will contact him with appt date and time. JG//CMA

## 2014-10-25 ENCOUNTER — Other Ambulatory Visit (INDEPENDENT_AMBULATORY_CARE_PROVIDER_SITE_OTHER): Payer: BLUE CROSS/BLUE SHIELD

## 2014-10-25 ENCOUNTER — Telehealth: Payer: Self-pay

## 2014-10-25 DIAGNOSIS — Z0000 Encounter for general adult medical examination without abnormal findings: Secondary | ICD-10-CM

## 2014-10-25 LAB — PSA: PSA: 0.64 ng/mL (ref 0.10–4.00)

## 2014-10-25 LAB — HEPATIC FUNCTION PANEL
ALT: 41 U/L (ref 0–53)
AST: 27 U/L (ref 0–37)
Albumin: 4.3 g/dL (ref 3.5–5.2)
Alkaline Phosphatase: 52 U/L (ref 39–117)
BILIRUBIN TOTAL: 0.7 mg/dL (ref 0.2–1.2)
Bilirubin, Direct: 0.1 mg/dL (ref 0.0–0.3)
Total Protein: 6.5 g/dL (ref 6.0–8.3)

## 2014-10-25 LAB — URINALYSIS, ROUTINE W REFLEX MICROSCOPIC
Bilirubin Urine: NEGATIVE
HGB URINE DIPSTICK: NEGATIVE
Ketones, ur: NEGATIVE
Leukocytes, UA: NEGATIVE
Nitrite: NEGATIVE
RBC / HPF: NONE SEEN (ref 0–?)
Total Protein, Urine: NEGATIVE
URINE GLUCOSE: NEGATIVE
Urobilinogen, UA: 0.2 (ref 0.0–1.0)
WBC, UA: NONE SEEN (ref 0–?)
pH: 6 (ref 5.0–8.0)

## 2014-10-25 LAB — CBC WITH DIFFERENTIAL/PLATELET
BASOS PCT: 0.7 % (ref 0.0–3.0)
Basophils Absolute: 0 10*3/uL (ref 0.0–0.1)
EOS PCT: 3.8 % (ref 0.0–5.0)
Eosinophils Absolute: 0.2 10*3/uL (ref 0.0–0.7)
HCT: 49.3 % (ref 39.0–52.0)
Hemoglobin: 16.4 g/dL (ref 13.0–17.0)
LYMPHS PCT: 20.3 % (ref 12.0–46.0)
Lymphs Abs: 1.1 10*3/uL (ref 0.7–4.0)
MCHC: 33.2 g/dL (ref 30.0–36.0)
MCV: 92 fl (ref 78.0–100.0)
MONOS PCT: 11.8 % (ref 3.0–12.0)
Monocytes Absolute: 0.6 10*3/uL (ref 0.1–1.0)
Neutro Abs: 3.5 10*3/uL (ref 1.4–7.7)
Neutrophils Relative %: 63.4 % (ref 43.0–77.0)
Platelets: 222 10*3/uL (ref 150.0–400.0)
RBC: 5.36 Mil/uL (ref 4.22–5.81)
RDW: 14.3 % (ref 11.5–15.5)
WBC: 5.5 10*3/uL (ref 4.0–10.5)

## 2014-10-25 LAB — BASIC METABOLIC PANEL
BUN: 16 mg/dL (ref 6–23)
CALCIUM: 9.3 mg/dL (ref 8.4–10.5)
CHLORIDE: 105 meq/L (ref 96–112)
CO2: 28 mEq/L (ref 19–32)
Creatinine, Ser: 1.08 mg/dL (ref 0.40–1.50)
GFR: 76.2 mL/min (ref >60.00)
GLUCOSE: 91 mg/dL (ref 70–99)
POTASSIUM: 5 meq/L (ref 3.5–5.1)
Sodium: 139 mEq/L (ref 135–145)

## 2014-10-25 LAB — TSH: TSH: 1.21 u[IU]/mL (ref 0.35–4.50)

## 2014-10-25 LAB — LIPID PANEL
Cholesterol: 147 mg/dL (ref 0–200)
HDL: 37.6 mg/dL — AB (ref >39.00)
LDL CALC: 89 mg/dL (ref 0–99)
NONHDL: 109.4
TRIGLYCERIDES: 100 mg/dL (ref 0.0–149.0)
Total CHOL/HDL Ratio: 4
VLDL: 20 mg/dL (ref 0.0–40.0)

## 2014-10-25 NOTE — Telephone Encounter (Signed)
Labs entered for cpx 

## 2014-10-26 ENCOUNTER — Encounter: Payer: Self-pay | Admitting: Nurse Practitioner

## 2014-10-26 ENCOUNTER — Ambulatory Visit (INDEPENDENT_AMBULATORY_CARE_PROVIDER_SITE_OTHER): Payer: BLUE CROSS/BLUE SHIELD | Admitting: Nurse Practitioner

## 2014-10-26 VITALS — BP 118/70 | HR 54 | Ht 68.0 in | Wt 167.0 lb

## 2014-10-26 DIAGNOSIS — R197 Diarrhea, unspecified: Secondary | ICD-10-CM

## 2014-10-26 MED ORDER — MOVIPREP 100 G PO SOLR: 1.0000 | ORAL | Status: DC

## 2014-10-26 MED ORDER — METRONIDAZOLE 500 MG PO TABS: 500.0000 mg | ORAL_TABLET | Freq: Three times a day (TID) | ORAL | Status: AC

## 2014-10-26 NOTE — Patient Instructions (Addendum)
You have been scheduled for a colonoscopy. Please follow written instructions given to you at your visit today.  Please pick up your prep kit at the pharmacy within the next 1-3 days. [facility]. If you use inhalers (even only as needed), please bring them with you on the day of your procedure.  We sent a prescription for Flagyl (Metronidazole) 500 mg take 1 tab 3 times daily for 10 days. Take with food.

## 2014-10-28 ENCOUNTER — Encounter: Payer: Self-pay | Admitting: Nurse Practitioner

## 2014-10-28 NOTE — Progress Notes (Signed)
HPI :  Patient is a 54 year old male known remotely to Dr. Ardis Hughs. He had a colonoscopy in 2009 for evaluation of bleeding. Exam was normal.   Patient is referred by PCP for evaluation of diarrhea. He took azithromycin in mid December. Shortly after starting antibiotics patient developed malodorous diarrhea. He was earlier this month at Urgent Care as well as PCP. Given Flagyl for presumed C-diff. Stool studies were obtained.  Lactoferrin was positive but culture, O and P, and C-diff PCR were negative. Basic labs including an ESR were unremarkable. Diarrhea improved by about 25% but he relapsed a couple of days after completion of flagyl.  Mainly having postprandial loose stool, no significant nocturnal diarrhea. He has mild cramps. No blood in stool. No fevers. No weight loss. No recent med changes.  No rashes. His throat is still a little sore.   Past Medical History  Diagnosis Date  . ALLERGIC RHINITIS 12/07/2007  . HEMATOCHEZIA 10/03/2007  . HYPERLIPIDEMIA 12/07/2007  . Pure hypercholesterolemia 08/18/2007  . SINUSITIS- ACUTE-NOS 11/25/2009  . URI 11/18/2010  . Bradycardia, sinus 07/10/2011  . Family history of colon cancer 08/11/2013    mother, age 51    Past Surgical History  Procedure Laterality Date  . Inguinal herniorrhapy      left  . Tonsillectomy    . Hx of fractured right scapula      Family History  Problem Relation Age of Onset  . Diabetes Sister   . Cancer Maternal Grandmother    History  Substance Use Topics  . Smoking status: Never Smoker   . Smokeless tobacco: Never Used  . Alcohol Use: Yes   Current Outpatient Prescriptions  Medication Sig Dispense Refill  . aspirin 81 MG tablet Take 81 mg by mouth daily.    Marland Kitchen atorvastatin (LIPITOR) 20 MG tablet TAKE 1 TABLET DAILY 90 tablet 3  . irbesartan (AVAPRO) 75 MG tablet TAKE 1 TABLET (75 MG TOTAL) BY MOUTH DAILY. 90 tablet 0  . metroNIDAZOLE (FLAGYL) 500 MG tablet Take 1 tablet (500 mg total) by mouth 3 (three)  times daily. 30 tablet 0  . MOVIPREP 100 G SOLR Take 1 kit (200 g total) by mouth as directed. 1 kit 0   No current facility-administered medications for this visit.   Allergies  Allergen Reactions  . Pseudoephedrine      Review of Systems: All systems reviewed and negative except where noted in HPI.   Physical Exam: BP 118/70 mmHg  Pulse 54  Ht $R'5\' 8"'fs$  (1.727 m)  Wt 167 lb (75.751 kg)  BMI 25.40 kg/m2  SpO2 98% Constitutional: Pleasant,well-developed, white male in no acute distress. HEENT: Normocephalic and atraumatic. Conjunctivae are normal. No scleral icterus. Mouth: no candida seen Neck supple.  Cardiovascular: Normal rate, regular rhythm.  Pulmonary/chest: Effort normal and breath sounds normal. No wheezing, rales or rhonchi. Abdominal: Soft, nondistended, nontender. Bowel sounds active throughout. There are no masses palpable. No hepatomegaly. Extremities: no edema Lymphadenopathy: No cervical adenopathy noted. Neurological: Alert and oriented to person place and time. Skin: Skin is warm and dry. No rashes noted. Psychiatric: Normal mood and affect. Behavior is normal.   ASSESSMENT AND PLAN:  54 year old male with acute diarrhea / mild cramps following azithromycin. Stool lactoferrin positive but culture, O&P and c-diff negative.  He had mild improvement with empiric flagyl.   Will repeat a 10 day course of flagyl.   Agree with probiotics as recommended by PCP.   Can use Imodium  as needed.   If no improvement patient may need a colonoscopy since diarrhea present almost a month now.   He will call with a condition update after completion of flagyl.   Sister cannot have gluten (?celiac). Patient has no personal history of gluten sensitivity but something to keep in mind if symptoms don't improve.   2. [facility] of colon cancer (new). Mother diagnosed in her 30's. Given advanced age at time of diagnosis patient's personal risk of colon cancer probably not significantly  increased.

## 2014-10-29 DIAGNOSIS — R197 Diarrhea, unspecified: Secondary | ICD-10-CM | POA: Insufficient documentation

## 2014-10-30 ENCOUNTER — Encounter: Payer: Self-pay | Admitting: Internal Medicine

## 2014-10-30 ENCOUNTER — Ambulatory Visit (INDEPENDENT_AMBULATORY_CARE_PROVIDER_SITE_OTHER): Payer: BLUE CROSS/BLUE SHIELD | Admitting: Internal Medicine

## 2014-10-30 VITALS — BP 112/70 | HR 40 | Temp 97.6°F | Ht 68.0 in | Wt 174.8 lb

## 2014-10-30 DIAGNOSIS — Z0000 Encounter for general adult medical examination without abnormal findings: Secondary | ICD-10-CM

## 2014-10-30 MED ORDER — IRBESARTAN 75 MG PO TABS: ORAL_TABLET | ORAL | Status: DC

## 2014-10-30 NOTE — Progress Notes (Signed)
Subjective:    Patient ID: Jaime Curtis, male    DOB: 16-Dec-1960, 54 y.o.   MRN: [id number]  HPI  Here for wellness and f/u;  Overall doing ok;  Pt denies CP, worsening SOB, DOE, wheezing, orthopnea, PND, worsening LE edema, palpitations, dizziness or syncope.  Pt denies neurological change such as new headache, facial or extremity weakness.  Pt denies polydipsia, polyuria, or low sugar symptoms. Pt states overall good compliance with treatment and medications, good tolerability, and has been trying to follow lower cholesterol diet.  Pt denies worsening depressive symptoms, suicidal ideation or panic. No fever, night sweats, wt loss, loss of appetite, or other constitutional symptoms.  Pt states good ability with ADL's, has low fall risk, home safety reviewed and adequate, no other significant changes in hearing or vision, and only occasionally active with exercise.  Has chronic bradycardia, asympt. A Denies worsening reflux, abd pain, dysphagia, n/v, bowel change or blood, except for just over 1 month ongoing diarrhea, saw GI jan 18, cdiff neg, but finishing second course of flagyl, not sure if helping. For colonoscopy soon.  Past Medical History  Diagnosis Date  . ALLERGIC RHINITIS 12/07/2007  . CHEST PAIN 12/07/2007  . DARK URINE 12/07/2007  . HEMATOCHEZIA 10/03/2007  . HYPERLIPIDEMIA 12/07/2007  . NECK PAIN 11/18/2010  . Pure hypercholesterolemia 08/18/2007  . SINUSITIS- ACUTE-NOS 11/25/2009  . URI 11/18/2010  . Bradycardia, sinus 07/10/2011  . Family history of colon cancer 08/11/2013    mother, age 18   Past Surgical History  Procedure Laterality Date  . Inguinal herniorrhapy      left  . Tonsillectomy    . Hx of fractured right scapula      reports that he has never smoked. He has never used smokeless tobacco. He reports that he drinks alcohol. He reports that he does not use illicit drugs. family history includes Cancer in his maternal grandmother; Diabetes in his sister. Allergies    Allergen Reactions  . Pseudoephedrine    Current Outpatient Prescriptions on File Prior to Visit  Medication Sig Dispense Refill  . aspirin 81 MG tablet Take 81 mg by mouth daily.    Marland Kitchen atorvastatin (LIPITOR) 20 MG tablet TAKE 1 TABLET DAILY 90 tablet 3  . irbesartan (AVAPRO) 75 MG tablet TAKE 1 TABLET (75 MG TOTAL) BY MOUTH DAILY. 90 tablet 0  . metroNIDAZOLE (FLAGYL) 500 MG tablet Take 1 tablet (500 mg total) by mouth 3 (three) times daily. 30 tablet 0  . MOVIPREP 100 G SOLR Take 1 kit (200 g total) by mouth as directed. 1 kit 0   No current facility-administered medications on file prior to visit.   Review of Systems Constitutional: Negative for increased diaphoresis, other activity, appetite or other siginficant weight change  HENT: Negative for worsening hearing loss, ear pain, facial swelling, mouth sores and neck stiffness.   Eyes: Negative for other worsening pain, redness or visual disturbance.  Respiratory: Negative for shortness of breath and wheezing.   Cardiovascular: Negative for chest pain and palpitations.  Gastrointestinal: Negative for diarrhea, blood in stool, abdominal distention or other pain Genitourinary: Negative for hematuria, flank pain or change in urine volume.  Musculoskeletal: Negative for myalgias or other joint complaints.  Skin: Negative for color change and wound.  Neurological: Negative for syncope and numbness. other than noted Hematological: Negative for adenopathy. or other swelling Psychiatric/Behavioral: Negative for hallucinations, self-injury, decreased concentration or other worsening agitation.      Objective:   Physical Exam  BP 112/70 mmHg  Pulse 40  Temp(Src) 97.6 F (36.4 C) (Oral)  Ht $R'5\' 8"'jb$  (1.727 m)  Wt 174 lb 12 oz (79.266 kg)  BMI 26.58 kg/m2  SpO2 97% VS noted,  Constitutional: Pt is oriented to person, place, and time. Appears well-developed and well-nourished.  Head: Normocephalic and atraumatic.  Right Ear: External ear  normal.  Left Ear: External ear normal.  Nose: Nose normal.  Mouth/Throat: Oropharynx is clear and moist.  Eyes: Conjunctivae and EOM are normal. Pupils are equal, round, and reactive to light.  Neck: Normal range of motion. Neck supple. No JVD present. No tracheal deviation present.  Cardiovascular: Normal rate, regular rhythm, normal heart sounds and intact distal pulses.   Pulmonary/Chest: Effort normal and breath sounds without rales or wheezing  Abdominal: Soft. Bowel sounds are normal. NT. No HSM  Musculoskeletal: Normal range of motion. Exhibits no edema.  Lymphadenopathy:  Has no cervical adenopathy.  Neurological: Pt is alert and oriented to person, place, and time. Pt has normal reflexes. No cranial nerve deficit. Motor grossly intact Skin: Skin is warm and dry. No rash noted.  Psychiatric:  Has normal mood and affect. Behavior is normal.      Assessment & Plan:

## 2014-10-30 NOTE — Progress Notes (Signed)
Pre visit review using our clinic review tool, if applicable. No additional management support is needed unless otherwise documented below in the visit note. 

## 2014-10-30 NOTE — Assessment & Plan Note (Addendum)

## 2014-10-30 NOTE — Patient Instructions (Signed)
Please continue all other medications as before, and refills have been done if requested.  Please have the pharmacy call with any other refills you may need.  Please continue your efforts at being more active, low cholesterol diet, and weight control.  You are otherwise up to date with prevention measures today.  Please keep your appointments with your specialists as you may have planned  Please return in 1 year for your yearly visit, or sooner if needed, with Lab testing done 3-5 days before  

## 2014-11-01 ENCOUNTER — Telehealth: Payer: Self-pay | Admitting: Nurse Practitioner

## 2014-11-03 NOTE — Telephone Encounter (Signed)
Sheri, if he is near completion of flagyl and still have significant diarrhea please arrange for him to have a colonoscopy (we discussed this at visit). Nothing is helping him and stool pathogen panel negative but lactoferrin is positive. We need to find out what is going on with him.  Thanks, Gunnar FusiPaula

## 2014-11-04 NOTE — Progress Notes (Signed)
i agree with the above note, plan 

## 2014-11-05 NOTE — Telephone Encounter (Signed)
Left message for patient to call back  

## 2014-11-05 NOTE — Telephone Encounter (Signed)
Patient called and left voicemail that he is already scheduled for colon in Feb.  He has been taking imodium and seems to control the diarrhea and that he will keep the appt for Feb

## 2014-12-07 ENCOUNTER — Encounter: Payer: Self-pay | Admitting: Gastroenterology

## 2014-12-07 ENCOUNTER — Ambulatory Visit (AMBULATORY_SURGERY_CENTER): Payer: BLUE CROSS/BLUE SHIELD | Admitting: Gastroenterology

## 2014-12-07 VITALS — BP 131/65 | HR 35 | Temp 97.0°F | Resp 28 | Ht 68.0 in | Wt 167.0 lb

## 2014-12-07 DIAGNOSIS — K639 Disease of intestine, unspecified: Secondary | ICD-10-CM

## 2014-12-07 DIAGNOSIS — K529 Noninfective gastroenteritis and colitis, unspecified: Secondary | ICD-10-CM

## 2014-12-07 DIAGNOSIS — R197 Diarrhea, unspecified: Secondary | ICD-10-CM

## 2014-12-07 MED ORDER — SODIUM CHLORIDE 0.9 % IV SOLN: 500.0000 mL | INTRAVENOUS | Status: DC

## 2014-12-07 NOTE — Progress Notes (Signed)
Procedure ends, to recovery, report given and VSS. 

## 2014-12-07 NOTE — Op Note (Signed)
[facility] [address] [place], [id number]   COLONOSCOPY PROCEDURE REPORT  PATIENT: Jaime Curtis, Jaime Curtis  MR#: [id number] BIRTHDATE: 12/03/1960 , 53  yrs. old GENDER: male ENDOSCOPIST: Rachael Feeaniel P Jacobs, MD PROCEDURE DATE:  12/07/2014 PROCEDURE:   Colonoscopy with biopsy First Screening Colonoscopy - Avg.  risk and is 50 yrs.  old or older - No.  Prior Negative Screening - Now for repeat screening. N/A  History of Adenoma - Now for follow-up colonoscopy & has been > or = to 3 yrs.  N/A  Polyps Removed Today? No.  Recommend repeat exam, <10 yrs? No. ASA CLASS:   Class II INDICATIONS:recent diarrheal illness that followed antibiotics; presumed C.  diff and slowly improved after 2 courses flagyl; past week or two bowels nearly completely back to normal. MEDICATIONS: Monitored anesthesia care and Propofol 300 mg IV  DESCRIPTION OF PROCEDURE:   After the risks benefits and alternatives of the procedure were thoroughly explained, informed consent was obtained.  The digital rectal exam revealed no abnormalities of the rectum.   The LB [id number]  endoscope was introduced through the anus and advanced to the terminal ileum which was intubated for a short distance. No adverse events experienced.   The quality of the prep was excellent.  The instrument was then slowly withdrawn as the colon was fully examined.    COLON FINDINGS: The terminal ileum was normal.  The colon mucosa was diffusely granular, slightly nodular.  This did not appear neoplastic, biopseis were take randomly throughout right and left colon.  The rectum was erythematous, slightly inflamed appearing tapering to normal mucosa at 15cm.  The rectum was biopsied and sent to pathology.  The examination was otherwise normal. Retroflexed views revealed no abnormalities. The time to cecum=5 minutes 15 seconds.  Withdrawal time=13 minutes 46 seconds.  The scope was withdrawn and the procedure  completed. COMPLICATIONS: There were no immediate complications.  ENDOSCOPIC IMPRESSION: The terminal ileum was normal.  The colon mucosa was diffusely granular, slightly nodular.  This did not appear neoplastic, biopseis were take randomly throughout right and left colon.  The rectum was erythematous, slightly inflamed appearing tapering to normal mucosa at 15cm.  The rectum was biopsied and sent to pathology.  The examination was otherwise normal  RECOMMENDATIONS: Await final pathology.  I suspect these findings are related to his recent (improved) diarrhea illness.  eSigned:  Rachael Feeaniel P Jacobs, MD 12/07/2014 3:35 PM

## 2014-12-07 NOTE — Progress Notes (Signed)
Called to room to assist during endoscopic procedure.  Patient ID and intended procedure confirmed with present staff. Received instructions for my participation in the procedure from the performing physician.  

## 2014-12-07 NOTE — Patient Instructions (Signed)
YOU HAD AN ENDOSCOPIC PROCEDURE TODAY AT THE [facility]: Refer to the procedure report that was given to you for any specific questions about what was found during the examination.  If the procedure report does not answer your questions, please call your gastroenterologist to clarify.  If you requested that your care partner not be given the details of your procedure findings, then the procedure report has been included in a sealed envelope for you to review at your convenience later.  YOU SHOULD EXPECT: Some feelings of bloating in the abdomen. Passage of more gas than usual.  Walking can help get rid of the air that was put into your GI tract during the procedure and reduce the bloating. If you had a lower endoscopy (such as a colonoscopy or flexible sigmoidoscopy) you may notice spotting of blood in your stool or on the toilet paper. If you underwent a bowel prep for your procedure, then you may not have a normal bowel movement for a few days.  Please Note:  You might notice some irritation in your nose or some drainage.  This may cause some feelings of congestion.  This is from the oxygen used during your procedure.  There is no need for concern and it should clear up in a day or so.  DIET: Your first meal following the procedure should be a small meal and then it is ok to progress to your normal diet. Heavy or fried foods are harder to digest and may make you feel nauseous or bloated.  Likewise, meals heavy in dairy and vegetables can also increase bloating.  Drink plenty of fluids but you should avoid alcoholic beverages for 24 hours.  ACTIVITY:  You should plan to take it easy for the rest of today and you should NOT DRIVE or use heavy machinery until tomorrow (because of the sedation medicines used during the test).    SYMPTOMS TO REPORT IMMEDIATELY:  A gastroenterologist can be reached at any hour by calling [phone].   Following lower endoscopy (colonoscopy or flexible  sigmoidoscopy):  Excessive amounts of blood in the stool  Significant tenderness or worsening of abdominal pains  Swelling of the abdomen that is new, acute  Fever of 100F or higher   FOLLOW UP: If any biopsies were taken you will be contacted by phone or by letter within the next 1-3 weeks.  Call your gastroenterologist if you have not heard about the biopsies in 3 weeks.  Our staff will call the home number listed on your records the next business day following your procedure to check on you and address any questions or concerns that you may have at that time regarding the information given to you following your procedure. This is a courtesy call and so if there is no answer at the home number and we have not heard from you through the emergency physician on call, we will assume that you have returned to your regular daily activities without incident.  SIGNATURES/CONFIDENTIALITY: You and/or your care partner have signed paperwork which will be entered into your electronic medical record.  These signatures attest to the fact that that the information above on your After Visit Summary has been reviewed and is understood.  Full responsibility of the confidentiality of this discharge information lies with you and/or your care-partner.  Recommendations Discharge instructions given to patient and/or care partner. Biopsy results pending.

## 2014-12-10 ENCOUNTER — Telehealth: Payer: Self-pay | Admitting: *Deleted

## 2014-12-10 NOTE — Telephone Encounter (Signed)
  Follow up Call-  Call back number 12/07/2014  Post procedure Call Back phone  # [phone]  Permission to leave phone message Yes     Patient questions:  Do you have a fever, pain , or abdominal swelling? No. Pain Score  0 *  Have you tolerated food without any problems? Yes.    Have you been able to return to your normal activities? Yes.    Do you have any questions about your discharge instructions: Diet   No. Medications  No. Follow up visit  No.  Do you have questions or concerns about your Care? No.  Actions: * If pain score is 4 or above: No action needed, pain <4.

## 2015-02-26 ENCOUNTER — Encounter: Payer: Self-pay | Admitting: Gastroenterology

## 2015-02-26 ENCOUNTER — Ambulatory Visit (INDEPENDENT_AMBULATORY_CARE_PROVIDER_SITE_OTHER): Payer: BLUE CROSS/BLUE SHIELD | Admitting: Gastroenterology

## 2015-02-26 VITALS — BP 118/72 | HR 44 | Ht 66.75 in | Wt 177.0 lb

## 2015-02-26 DIAGNOSIS — R197 Diarrhea, unspecified: Secondary | ICD-10-CM | POA: Diagnosis not present

## 2015-02-26 NOTE — Patient Instructions (Signed)
You most likely had an infectious diarrhea (presumed C. Diff). Please call if your symptoms return.

## 2015-02-26 NOTE — Progress Notes (Signed)
Review of pertinent gastrointestinal problems: 1. Diarrheal illness; presumed C. Diff 2016: C. Diff neg, but started after abx and responded to flagyl.  Eventual colonoscopy Dr. Ardis Hughs 12/2014 was normal to TI except slightly granular mucosa.  Biopsies showed focal, acute colitis. Workup 2016 Lactoferrin was positive but culture, O and P, and C-diff PCR were negative. Basic labs including an ESR were unremarkable.  02/2015; his diarrhea improved   HPI: This is a very pleasant 54 year old man whom I last saw to 3 months ago time of colonoscopy. See that results summarized above.  Chief complaint is diarrhea  His diarrhea has really improved or he is back to his usual bowel regimen. He has no significant abdominal pains. He did not lose any weight through all this.     Past Medical History  Diagnosis Date  . ALLERGIC RHINITIS 12/07/2007  . CHEST PAIN 12/07/2007  . DARK URINE 12/07/2007  . HEMATOCHEZIA 10/03/2007  . HYPERLIPIDEMIA 12/07/2007  . NECK PAIN 11/18/2010  . Pure hypercholesterolemia 08/18/2007  . SINUSITIS- ACUTE-NOS 11/25/2009  . URI 11/18/2010  . Bradycardia, sinus 07/10/2011  . Family history of colon cancer 08/11/2013    mother, age 50  . Allergy   . 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 Inguinal herniorrhapy      left  . Tonsillectomy    . Hx of fractured right scapula    . Baha bone anchored hearing aide Right   . Colonoscopy      6-8 years ago    Current Outpatient Prescriptions  Medication Sig Dispense Refill  . aspirin 81 MG tablet Take 81 mg by mouth daily.    Marland Kitchen atorvastatin (LIPITOR) 20 MG tablet TAKE 1 TABLET DAILY 90 tablet 3  . irbesartan (AVAPRO) 75 MG tablet TAKE 1 TABLET (75 MG TOTAL) BY MOUTH DAILY. 90 tablet 3   No current facility-administered medications for this visit.    Allergies as of 02/26/2015 - Review Complete 02/26/2015  Allergen Reaction Noted  .  Pseudoephedrine  12/07/2007    Family History  Problem Relation Age of Onset  . Diabetes Sister   . Cancer Maternal Grandmother     type unknown  . Colon cancer Mother 38    doing well s/p colon surgery  . Rectal cancer Neg Hx   . Stomach cancer Neg Hx     History   Social History  . Marital Status: Married    Spouse Name: N/A  . Number of Children: 3  . Years of Education: N/A   Occupational History  . Biochemist, clinical    Social History Main Topics  . Smoking status: Never Smoker   . Smokeless tobacco: Never Used  . Alcohol Use: 0.0 oz/week    0 Standard drinks or equivalent per week     Comment: socially  . Drug Use: No  . Sexual Activity: Not on file   Other Topics Concern  . Not on file   Social History Narrative     Physical Exam: BP 118/72 mmHg  Pulse 44  Ht 5' 6.75" (1.695 m)  Wt 177 lb (80.287 kg)  BMI 27.95 kg/m2 Constitutional: generally well-appearing Psychiatric: alert and oriented x3 Abdomen: soft, nontender, nondistended, no obvious ascites, no peritoneal signs, normal bowel sounds   Assessment and plan: 54 y.o. male with likely resolved infectious diarrhea  I presume this to be C. difficile related  or at least antibiotic related. Symptoms started shortly after starting azathioprine might in. He was never proven to have C. difficile. Colonoscopy showed a normal terminal ileum and granular mucosa to his colon showed focal active colitis. No suggestions of inflammatory bowel disease or Crohn's disease. He responded to observation only and is back to normal now. I explained to him if he has any recurrence of the diarrhea after rethink this. I would likely resend stool testing, sedimentation rate at that time.   Owens Loffler, MD [facility] 02/26/2015, 2:19 PM

## 2015-04-12 ENCOUNTER — Encounter: Payer: Self-pay | Admitting: Gastroenterology

## 2015-07-30 ENCOUNTER — Other Ambulatory Visit: Payer: Self-pay | Admitting: Internal Medicine

## 2015-10-28 ENCOUNTER — Other Ambulatory Visit (INDEPENDENT_AMBULATORY_CARE_PROVIDER_SITE_OTHER): Payer: BLUE CROSS/BLUE SHIELD

## 2015-10-28 DIAGNOSIS — Z0000 Encounter for general adult medical examination without abnormal findings: Secondary | ICD-10-CM

## 2015-10-28 LAB — CBC WITH DIFFERENTIAL/PLATELET
BASOS ABS: 0.1 10*3/uL (ref 0.0–0.1)
Basophils Relative: 0.9 % (ref 0.0–3.0)
EOS ABS: 0.3 10*3/uL (ref 0.0–0.7)
Eosinophils Relative: 3.9 % (ref 0.0–5.0)
HEMATOCRIT: 46.5 % (ref 39.0–52.0)
Hemoglobin: 15.4 g/dL (ref 13.0–17.0)
LYMPHS PCT: 17.6 % (ref 12.0–46.0)
Lymphs Abs: 1.3 10*3/uL (ref 0.7–4.0)
MCHC: 33.2 g/dL (ref 30.0–36.0)
MCV: 92.2 fl (ref 78.0–100.0)
Monocytes Absolute: 0.6 10*3/uL (ref 0.1–1.0)
Monocytes Relative: 7.6 % (ref 3.0–12.0)
NEUTROS ABS: 5.1 10*3/uL (ref 1.4–7.7)
Neutrophils Relative %: 70 % (ref 43.0–77.0)
PLATELETS: 205 10*3/uL (ref 150.0–400.0)
RBC: 5.04 Mil/uL (ref 4.22–5.81)
RDW: 13.8 % (ref 11.5–15.5)
WBC: 7.3 10*3/uL (ref 4.0–10.5)

## 2015-10-28 LAB — BASIC METABOLIC PANEL
BUN: 22 mg/dL (ref 6–23)
CALCIUM: 9 mg/dL (ref 8.4–10.5)
CO2: 23 meq/L (ref 19–32)
CREATININE: 1.04 mg/dL (ref 0.40–1.50)
Chloride: 109 mEq/L (ref 96–112)
GFR: 79.29 mL/min (ref >60.00)
Glucose, Bld: 94 mg/dL (ref 70–99)
Potassium: 4.6 mEq/L (ref 3.5–5.1)
Sodium: 143 mEq/L (ref 135–145)

## 2015-10-28 LAB — URINALYSIS, ROUTINE W REFLEX MICROSCOPIC
BILIRUBIN URINE: NEGATIVE
Hgb urine dipstick: NEGATIVE
KETONES UR: NEGATIVE
Leukocytes, UA: NEGATIVE
Nitrite: NEGATIVE
PH: 6 (ref 5.0–8.0)
RBC / HPF: NONE SEEN (ref 0–?)
Specific Gravity, Urine: 1.025 (ref 1.000–1.030)
TOTAL PROTEIN, URINE-UPE24: NEGATIVE
URINE GLUCOSE: NEGATIVE
UROBILINOGEN UA: 0.2 (ref 0.0–1.0)
WBC UA: NONE SEEN (ref 0–?)

## 2015-10-28 LAB — LIPID PANEL
CHOL/HDL RATIO: 3
Cholesterol: 144 mg/dL (ref 0–200)
HDL: 43.4 mg/dL (ref >39.00)
LDL CALC: 88 mg/dL (ref 0–99)
NONHDL: 100.15
Triglycerides: 59 mg/dL (ref 0.0–149.0)
VLDL: 11.8 mg/dL (ref 0.0–40.0)

## 2015-10-28 LAB — HEPATIC FUNCTION PANEL
ALK PHOS: 51 U/L (ref 39–117)
ALT: 21 U/L (ref 0–53)
AST: 21 U/L (ref 0–37)
Albumin: 4.1 g/dL (ref 3.5–5.2)
BILIRUBIN DIRECT: 0.1 mg/dL (ref 0.0–0.3)
BILIRUBIN TOTAL: 0.5 mg/dL (ref 0.2–1.2)
TOTAL PROTEIN: 6.2 g/dL (ref 6.0–8.3)

## 2015-10-28 LAB — PSA: PSA: 0.61 ng/mL (ref 0.10–4.00)

## 2015-10-28 LAB — TSH: TSH: 1.35 u[IU]/mL (ref 0.35–4.50)

## 2015-11-01 ENCOUNTER — Encounter: Payer: Self-pay | Admitting: Internal Medicine

## 2015-11-01 ENCOUNTER — Ambulatory Visit (INDEPENDENT_AMBULATORY_CARE_PROVIDER_SITE_OTHER): Payer: BLUE CROSS/BLUE SHIELD | Admitting: Internal Medicine

## 2015-11-01 VITALS — BP 126/82 | HR 61 | Temp 98.6°F | Ht 67.0 in | Wt 179.0 lb

## 2015-11-01 DIAGNOSIS — M25511 Pain in right shoulder: Secondary | ICD-10-CM | POA: Diagnosis not present

## 2015-11-01 DIAGNOSIS — E7800 Pure hypercholesterolemia, unspecified: Secondary | ICD-10-CM | POA: Diagnosis not present

## 2015-11-01 DIAGNOSIS — M7712 Lateral epicondylitis, left elbow: Secondary | ICD-10-CM | POA: Diagnosis not present

## 2015-11-01 DIAGNOSIS — Z0000 Encounter for general adult medical examination without abnormal findings: Secondary | ICD-10-CM | POA: Diagnosis not present

## 2015-11-01 MED ORDER — ATORVASTATIN CALCIUM 20 MG PO TABS: 20.0000 mg | ORAL_TABLET | Freq: Every day | ORAL | Status: DC

## 2015-11-01 MED ORDER — IRBESARTAN 75 MG PO TABS: ORAL_TABLET | ORAL | Status: DC

## 2015-11-01 NOTE — Patient Instructions (Addendum)
Please continue all other medications as before, and refills have been done if requested.  Please have the pharmacy call with any other refills you may need.  Please continue your efforts at being more active, low cholesterol diet, and weight control.  You are otherwise up to date with prevention measures today.  Please keep your appointments with your specialists as you may have planned  You will be contacted regarding the referral for: Dr Katrinka Blazing - sports medicine  Please return in 1 year for your yearly visit, or sooner if needed, with Lab testing done 3-5 days before

## 2015-11-01 NOTE — Assessment & Plan Note (Signed)

## 2015-11-01 NOTE — Progress Notes (Signed)
Pre visit review using our clinic review tool, if applicable. No additional management support is needed unless otherwise documented below in the visit note. 

## 2015-11-01 NOTE — Progress Notes (Signed)
Subjective:    Patient ID: Jaime Curtis, male    DOB: 07/01/1961, 55 y.o.   MRN: [id number]  HPI  Here for wellness and f/u;  Overall doing ok;  Pt denies Chest pain, worsening SOB, DOE, wheezing, orthopnea, PND, worsening LE edema, palpitations, dizziness or syncope.  Pt denies neurological change such as new headache, facial or extremity weakness.  Pt denies polydipsia, polyuria, or low sugar symptoms. Pt states overall good compliance with treatment and medications, good tolerability, and has been trying to follow appropriate diet.  Pt denies worsening depressive symptoms, suicidal ideation or panic. No fever, night sweats, wt loss, loss of appetite, or other constitutional symptoms.  Pt states good ability with ADL's, has low fall risk, home safety reviewed and adequate, no other significant changes in hearing or vision, and only occasionally active with exercise.  Does c/o 2 wks worsening right shoudler pain, worse to forward elevate and abduct, sometimes worse after swimming he does several times per wk.  Also with 3 wks onset left elbow pain, worse to use the arm but not bend the elbow, mild, intermittent, worse to pick up heavy object, better to rest. Past Medical History  Diagnosis Date  . ALLERGIC RHINITIS 12/07/2007  . CHEST PAIN 12/07/2007  . DARK URINE 12/07/2007  . HEMATOCHEZIA 10/03/2007  . HYPERLIPIDEMIA 12/07/2007  . NECK PAIN 11/18/2010  . Pure hypercholesterolemia 08/18/2007  . SINUSITIS- ACUTE-NOS 11/25/2009  . URI 11/18/2010  . Bradycardia, sinus 07/10/2011  . Family history of colon cancer 08/11/2013    mother, age 14  . Allergy   . 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 Inguinal herniorrhapy      left  . Tonsillectomy    . Hx of fractured right scapula    . [place] bone anchored hearing aide Right   . Colonoscopy      6-8 years ago    reports that he has never smoked. He has never  used smokeless tobacco. He reports that he drinks alcohol. He reports that he does not use illicit drugs. family history includes Cancer in his maternal grandmother; Colon cancer (age of onset: 73) in his mother; Diabetes in his sister. There is no history of Rectal cancer or Stomach cancer. Allergies  Allergen Reactions  . Pseudoephedrine    Current Outpatient Prescriptions on File Prior to Visit  Medication Sig Dispense Refill  . aspirin 81 MG tablet Take 81 mg by mouth daily.     No current facility-administered medications on file prior to visit.   Review of Systems Constitutional: Negative for increased diaphoresis, other activity, appetite or siginficant weight change other than noted HENT: Negative for worsening hearing loss, ear pain, facial swelling, mouth sores and neck stiffness.   Eyes: Negative for other worsening pain, redness or visual disturbance.  Respiratory: Negative for shortness of breath and wheezing  Cardiovascular: Negative for chest pain and palpitations.  Gastrointestinal: Negative for diarrhea, blood in stool, abdominal distention or other pain Genitourinary: Negative for hematuria, flank pain or change in urine volume.  Musculoskeletal: Negative for myalgias or other joint complaints.  Skin: Negative for color change and wound or drainage.  Neurological: Negative for syncope and numbness. other than noted Hematological: Negative for adenopathy. or other swelling Psychiatric/Behavioral: Negative for hallucinations, SI, self-injury, decreased concentration or other worsening agitation.      Objective:   Physical Exam BP  126/82 mmHg  Pulse 61  Temp(Src) 98.6 F (37 C) (Oral)  Ht  (1.702 m)  Wt 179 lb (81.194 kg)  BMI 28.03 kg/m2  SpO2 97% VS noted,  Constitutional: Pt is oriented to person, place, and time. Appears well-developed and well-nourished, in no significant distress Head: Normocephalic and atraumatic.  Right Ear: External ear normal.    Left Ear: External ear normal.  Nose: Nose normal.  Mouth/Throat: Oropharynx is clear and moist.  Eyes: Conjunctivae and EOM are normal. Pupils are equal, round, and reactive to light.  Neck: Normal range of motion. Neck supple. No JVD present. No tracheal deviation present or significant neck LA or mass Cardiovascular: Normal rate, regular rhythm, normal heart sounds and intact distal pulses.   Pulmonary/Chest: Effort normal and breath sounds without rales or wheezing  Abdominal: Soft. Bowel sounds are normal. NT. No HSM  Musculoskeletal: Normal range of motion. Exhibits no edema.  Lymphadenopathy:  Has no cervical adenopathy.  Neurological: Pt is alert and oriented to person, place, and time. Pt has normal reflexes. No cranial nerve deficit. Motor grossly intact Skin: Skin is warm and dry. No rash noted.  Psychiatric:  Has normal mood and affect. Behavior is normal.  Left elbow with tender non swollen lateral epicondylar Right shoulder with tender subacromial, o/w FROM ECG reviewed as per emr    Assessment & Plan:

## 2015-11-02 NOTE — Assessment & Plan Note (Signed)
Lab Results  Component Value Date   LDLCALC 88 10/28/2015   stable overall by history and exam, recent data reviewed with pt, and pt to continue medical treatment as before,  to f/u any worsening symptoms or concerns

## 2015-11-02 NOTE — Assessment & Plan Note (Signed)
Mild, for otc nsaid prn, rest, left forearm band ,  to f/u any worsening symptoms or concerns

## 2015-11-02 NOTE — Assessment & Plan Note (Signed)
C/w impingement syndrome, for otc nsaid, also refer Dr Smith/sport med  - ? Cortisone need

## 2015-11-11 ENCOUNTER — Encounter: Payer: Self-pay | Admitting: Family Medicine

## 2015-11-11 ENCOUNTER — Ambulatory Visit (INDEPENDENT_AMBULATORY_CARE_PROVIDER_SITE_OTHER): Payer: BLUE CROSS/BLUE SHIELD | Admitting: Family Medicine

## 2015-11-11 ENCOUNTER — Other Ambulatory Visit (INDEPENDENT_AMBULATORY_CARE_PROVIDER_SITE_OTHER): Payer: BLUE CROSS/BLUE SHIELD

## 2015-11-11 VITALS — BP 126/70 | HR 44 | Ht 68.0 in | Wt 181.0 lb

## 2015-11-11 DIAGNOSIS — M25511 Pain in right shoulder: Secondary | ICD-10-CM

## 2015-11-11 DIAGNOSIS — M7712 Lateral epicondylitis, left elbow: Secondary | ICD-10-CM | POA: Diagnosis not present

## 2015-11-11 DIAGNOSIS — M7551 Bursitis of right shoulder: Secondary | ICD-10-CM

## 2015-11-11 DIAGNOSIS — M899 Disorder of bone, unspecified: Secondary | ICD-10-CM

## 2015-11-11 MED ORDER — DICLOFENAC SODIUM 2 % TD SOLN: TRANSDERMAL | Status: DC

## 2015-11-11 NOTE — Progress Notes (Signed)
Pre visit review using our clinic review tool, if applicable. No additional management support is needed unless otherwise documented below in the visit note. 

## 2015-11-11 NOTE — Patient Instructions (Signed)
Good to see you  Ice is your friend Ice 20 minutes 2 times daily. Usually after activity and before bed. pennsaid pinkie amount topically 2 times daily as needed.  Exercises 3 times a week.  Consider over the counter medicines including Vitamin D 2000 IU daily to help with muscle strength Turmeric  daily for inflammation.  See me again in 3 weeks.

## 2015-11-11 NOTE — Assessment & Plan Note (Signed)
I believe the patient likely injury from his initial injury greater than 25 years ago. Discussed with patient and I do not think that this was rehabilitation Correctly. Work with Event organiser to learn home exercises in greater detail. We discussed icing regimen and home exercises. Patient and will come back and see me again in 3-4 weeks for further evaluation and treatment.

## 2015-11-11 NOTE — Progress Notes (Signed)
Jaime Curtis [address] [place], [place] [address] Phone: [phone] Subjective:    I'm seeing this patient by the request  of:  Jaime Barre, MD   CC: Right shoulder and left elbow pain  [id number] Jaime Curtis is a 55 y.o. male coming in with complaint of 2 problems. 1. Patient has had more of a right shoulder pain. Seems to be more of an insidious onset over multiple months. Does not remember any true injury. 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 Patient denies any radiation of pain or any weakness. States that it is just when he is trying to put a lot of pressure he notices some discomfort. Does not wake him up at night. Rates the severity of pain a 4 out of 10. Has not taken any over-the-counter medications.  Second problem is more of a left elbow pain. Seems to worsening over the course last several weeks. Hurts severely when lifting anything greater than 10 pounds. Patient states even certain range of motion hurts. States that there is pinpoint tenderness on the lateral aspect of the elbow. No numbness. No weakness. Does not remember any true injury. Rates the severity of pain a 6 out of 10.     Past Medical History  Diagnosis Date  . ALLERGIC RHINITIS 12/07/2007  . CHEST PAIN 12/07/2007  . DARK URINE 12/07/2007  . HEMATOCHEZIA 10/03/2007  . HYPERLIPIDEMIA 12/07/2007  . NECK PAIN 11/18/2010  . Pure hypercholesterolemia 08/18/2007  . SINUSITIS- ACUTE-NOS 11/25/2009  . URI 11/18/2010  . Bradycardia, sinus 07/10/2011  . Family history of colon cancer 08/11/2013    mother, age 39  . Allergy   . 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 Inguinal herniorrhapy      left  . Tonsillectomy    . Hx of fractured right scapula    . [place] bone anchored hearing aide Right   . Colonoscopy      6-8 years ago   Social History   Social  History  . Marital Status: Married    Spouse Name: N/A  . Number of Children: 3  . 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 Smoking status: Never Smoker   . Smokeless tobacco: Never Used  . Alcohol Use: 0.0 oz/week    0 Standard drinks or equivalent per week     Comment: socially  . Drug Use: No  . Sexual Activity: Not Asked   Other Topics Concern  . None   Social History Narrative   Allergies  Allergen Reactions  . Pseudoephedrine    Family History  Problem Relation Age of Onset  . Diabetes Sister   . Cancer Maternal Grandmother     type unknown  . Colon cancer Mother 106    doing well s/p colon surgery  . Rectal cancer Neg Hx   . Stomach cancer Neg Hx     Past medical history, social, surgical and family history all reviewed in electronic medical record.  No pertanent information unless stated regarding to the chief complaint.   Review of Systems: No headache, visual changes, nausea, vomiting, diarrhea, constipation, dizziness, abdominal pain, skin rash, fevers, chills, night sweats, weight loss, swollen lymph nodes, body aches, joint swelling, muscle aches, chest pain, shortness of breath, mood  changes.   Objective Blood pressure 126/70, pulse 44, height  (1.727 m), weight 181 lb (82.101 kg), SpO2 98 %.  General: No apparent distress alert and oriented x3 mood and affect normal, dressed appropriately.  HEENT: Pupils equal, extraocular movements intact  Respiratory: Patient's speak in full sentences and does not appear short of breath  Cardiovascular: No lower extremity edema, non tender, no erythema  Skin: Warm dry intact with no signs of infection or rash on extremities or on axial skeleton.  Abdomen: Soft nontender  Neuro: Cranial nerves II through XII are intact, neurovascularly intact in all extremities with 2+ DTRs and 2+ pulses.  Lymph: No lymphadenopathy of posterior or anterior cervical chain or  axillae bilaterally.  Gait normal with good balance and coordination.  MSK:  Non tender with full range of motion and good stability and symmetric strength and tone of  wrist, hip, knee and ankles bilaterally.  Shoulder: Right Inspection reveals no abnormalities, atrophy or asymmetry. Palpation is normal with no tenderness over AC joint or bicipital groove. ROM is full in all planes passively. Rotator cuff strength normal throughout. signs of impingement with positive Neer and Hawkin's tests, but negative empty can sign. Speeds and Yergason's tests normal. No labral pathology noted with negative Obrien's, negative clunk and good stability. Difficult scapular weakness on the right side No painful arc and no drop arm sign. No apprehension sign Contralateral shoulder unremarkable  Elbow: Left Unremarkable to inspection. Range of motion full pronation, supination, flexion, extension. Strength is full to all of the above directions Stable to varus, valgus stress. Negative moving valgus stress test. Tender to palpation over the lateral epicondylar region Ulnar nerve does not sublux. Negative cubital tunnel Tinel's. Contralateral elbow unremarkable  MSK US performed of: Right This study was ordered, performed, and interpreted by Jaime Curtis D.O.  Shoulder:   Supraspinatus:  Appears normal on long and transverse views does have increasing Doppler flow, Bursal bulge seen with shoulder abduction on impingement view. Infraspinatus:  Appears normal on long and transverse views. Significant increase in Doppler flow Subscapularis:  Appears normal on long and transverse views. Positive bursa Teres Minor:  Appears normal on long and transverse views. AC joint:  Mild arthritic changes Glenohumeral Joint:  Appears normal without effusion. Glenoid Labrum:  Intact without visualized tears. Biceps Tendon:  Appears normal on long and transverse views, no fraying of tendon, tendon located in  intertubercular groove, no subluxation with shoulder internal or external rotation.  Impression: Subacromial bursitis with mild tendinopathy   Procedure note 97110; 15 minutes spent for Therapeutic exercises as stated in above notes.  This included exercises focusing on stretching, strengthening, with significant focus on eccentric aspects.  Basic scapular stabilization to include adduction and depression of scapula Scaption, focusing on proper movement and good control Internal and External rotation utilizing a theraband, with elbow tucked at side entire time Rows with theraband Proper technique shown and discussed handout in great detail with ATC.  All questions were discussed and answered.     Impression and Recommendations:     This case required medical decision making of moderate complexity.      Note: This dictation was prepared with Dragon dictation along with smaller phrase technology. Any transcriptional errors that result from this process are unintentional.

## 2015-11-11 NOTE — Assessment & Plan Note (Signed)
Lateral Epicondylitis: Elbow anatomy was reviewed, and tendinopathy was explained.  Pt. given a formal rehab program. Series of concentric and eccentric exercises should be done starting with no weight, work up to 1 lb, hammer, etc.  Use counterforce strap if working or using hands.  Formal PT would be beneficial. Emphasized stretching an cross-friction massage Emphasized proper palms up lifting biomechanics to unload ECRB Discussed topical anti-inflammatories and icing as well. Prescription given.

## 2015-12-02 ENCOUNTER — Other Ambulatory Visit (INDEPENDENT_AMBULATORY_CARE_PROVIDER_SITE_OTHER): Payer: BLUE CROSS/BLUE SHIELD

## 2015-12-02 ENCOUNTER — Encounter: Payer: Self-pay | Admitting: Family Medicine

## 2015-12-02 ENCOUNTER — Ambulatory Visit (INDEPENDENT_AMBULATORY_CARE_PROVIDER_SITE_OTHER): Payer: BLUE CROSS/BLUE SHIELD | Admitting: Family Medicine

## 2015-12-02 VITALS — BP 132/82 | HR 45 | Wt 183.0 lb

## 2015-12-02 DIAGNOSIS — M899 Disorder of bone, unspecified: Secondary | ICD-10-CM | POA: Diagnosis not present

## 2015-12-02 DIAGNOSIS — M7551 Bursitis of right shoulder: Secondary | ICD-10-CM

## 2015-12-02 DIAGNOSIS — M7712 Lateral epicondylitis, left elbow: Secondary | ICD-10-CM | POA: Diagnosis not present

## 2015-12-02 NOTE — Assessment & Plan Note (Signed)
Resolved at this time.  °

## 2015-12-02 NOTE — Assessment & Plan Note (Signed)
Patient was having some worsening presentation with a possibility for labral pathology noted today. Patient elected to try an injection. We'll continue home exercises. Decline formal physical therapy. We discussed doing the topical medicine on a more regular basis as well as the icing protocol. Patient will then come back and see me again in approximately 4 weeks. If continuing to have difficulty imaging will be necessary. Differential includes cervical radiculopathy but this does not seem like it. Patient has had bilateral shoulder dislocations previously.

## 2015-12-02 NOTE — Patient Instructions (Addendum)
Good to see you  Ice is your friend The shot should help out and you should get better everyday for next 2 weeks Continue the exercises 3 times a week starting in 2 days.  Can continue the pennsaid as well See me again in 4-6 weeks.

## 2015-12-02 NOTE — Assessment & Plan Note (Signed)
Encourage patient to continue to work on strength.

## 2015-12-02 NOTE — Progress Notes (Signed)
[facility] [address] [place], [place] [address] Phone: [phone] Subjective:    I'm seeing this patient by the request  of:  Oliver Barre, MD   CC: Right shoulder and left elbow pain  f/u  [id number] Jaime Curtis is a 55 y.o. male coming in with complaint of 2 problems. 1. Patient has had more of a right shoulder pain. Patient was seen previously and was diagnosed with a subacromial bursitis. Patient was given home exercises, icing, and topical anti-inflammatories. Patient was to do over-the-counter medications. Patient states he's been no significant improvement on the shoulder. Continues to have discomfort. States that it's only when he does a lot of repetitive activity up with a lot of strength into it. Has been doing the exercises in the icing. States that the topical anti-inflammatories may be minimally beneficial. No new symptoms such as radiation down the arm or any numbness or weakness.  Second problem is more of a left elbow pain. Patient was having more of a lateral epicondylitis. Patient was to do bracing, home exercises, we discussed patient states that this must be better because he has not thought about it. This could also be because his shoulder has been giving her more pain.       Past Medical History  Diagnosis Date  . ALLERGIC RHINITIS 12/07/2007  . CHEST PAIN 12/07/2007  . DARK URINE 12/07/2007  . HEMATOCHEZIA 10/03/2007  . HYPERLIPIDEMIA 12/07/2007  . NECK PAIN 11/18/2010  . Pure hypercholesterolemia 08/18/2007  . SINUSITIS- ACUTE-NOS 11/25/2009  . URI 11/18/2010  . Bradycardia, sinus 07/10/2011  . Family history of colon cancer 08/11/2013    mother, age 66  . Allergy   . 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 Inguinal herniorrhapy      left  . Tonsillectomy    . Hx of fractured right scapula    . Baha bone anchored hearing aide Right    . Colonoscopy      6-8 years ago   Social History   Social History  . Marital Status: Married    Spouse Name: N/A  . Number of Children: 3  . 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 Smoking status: Never Smoker   . Smokeless tobacco: Never Used  . Alcohol Use: 0.0 oz/week    0 Standard drinks or equivalent per week     Comment: socially  . Drug Use: No  . Sexual Activity: Not Asked   Other Topics Concern  . None   Social History Narrative   Allergies  Allergen Reactions  . Pseudoephedrine    Family History  Problem Relation Age of Onset  . Diabetes Sister   . Cancer Maternal Grandmother     type unknown  . Colon cancer Mother 57    doing well s/p colon surgery  . Rectal cancer Neg Hx   . Stomach cancer Neg Hx     Past medical history, social, surgical and family history all reviewed in electronic medical record.  No pertanent information unless stated regarding to the chief complaint.   Review of Systems: No headache, visual changes, nausea, vomiting, diarrhea, constipation, dizziness, abdominal pain, skin rash, fevers, chills, night sweats, weight loss, swollen lymph nodes, body aches, joint swelling, muscle aches, chest pain, shortness of breath,  mood changes.   Objective Blood pressure 132/82, pulse 45, weight 183 lb (83.008 kg), SpO2 96 %.  General: No apparent distress alert and oriented x3 mood and affect normal, dressed appropriately.  HEENT: Pupils equal, extraocular movements intact  Respiratory: Patient's speak in full sentences and does not appear short of breath  Cardiovascular: No lower extremity edema, non tender, no erythema  Skin: Warm dry intact with no signs of infection or rash on extremities or on axial skeleton.  Abdomen: Soft nontender  Neuro: Cranial nerves II through XII are intact, neurovascularly intact in all extremities with 2+ DTRs and 2+ pulses.  Lymph: No lymphadenopathy  of posterior or anterior cervical chain or axillae bilaterally.  Gait normal with good balance and coordination.  MSK:  Non tender with full range of motion and good stability and symmetric strength and tone of  wrist, hip, knee and ankles bilaterally.  Shoulder: Right Inspection reveals no abnormalities, atrophy or asymmetry. Palpation is normal with no tenderness over AC joint or bicipital groove. ROM is full in all planes passively. Rotator cuff strength normal throughout. signs of impingement with positive Neer and Hawkin's tests, but negative empty can sign. Speeds and Yergason's tests normal. Questionable positive O'Brien's which is new. Difficult scapular weakness on the right side No painful arc and no drop arm sign. No apprehension sign Contralateral shoulder unremarkable Same sometimes if not worse than previous exam  Elbow: Left Unremarkable to inspection. Range of motion full pronation, supination, flexion, extension. Strength is full to all of the above directions Stable to varus, valgus stress. Negative moving valgus stress test. Nontender on exam today. Ulnar nerve does not sublux. Negative cubital tunnel Tinel's. Contralateral elbow unremarkable  Procedure: Real-time Ultrasound Guided Injection of right glenohumeral joint Device: GE Logiq E  Ultrasound guided injection is preferred based studies that show increased duration, increased effect, greater accuracy, decreased procedural pain, increased response rate with ultrasound guided versus blind injection.  Verbal informed consent obtained.  Time-out conducted.  Noted no overlying erythema, induration, or other signs of local infection.  Skin prepped in a sterile fashion.  Local anesthesia: Topical Ethyl chloride.  With sterile technique and under real time ultrasound guidance:  Joint visualized.  23g 1  inch needle inserted posterior approach. Pictures taken for needle placement. Patient did have injection of 2 cc  of 1% lidocaine, 2 cc of 0.5% Marcaine, and 1.0 cc of Kenalog 40 mg/dL. Completed without difficulty  Pain immediately resolved suggesting accurate placement of the medication.  Advised to call if fevers/chills, erythema, induration, drainage, or persistent bleeding.  Images permanently stored and available for review in the ultrasound unit.  Impression: Technically successful ultrasound guided injection.        Impression and Recommendations:     This case required medical decision making of moderate complexity.      Note: This dictation was prepared with Dragon dictation along with smaller phrase technology. Any transcriptional errors that result from this process are unintentional.

## 2016-01-06 ENCOUNTER — Ambulatory Visit (INDEPENDENT_AMBULATORY_CARE_PROVIDER_SITE_OTHER): Payer: BLUE CROSS/BLUE SHIELD | Admitting: Family Medicine

## 2016-01-06 ENCOUNTER — Encounter: Payer: Self-pay | Admitting: Family Medicine

## 2016-01-06 VITALS — BP 126/76 | HR 52 | Wt 181.0 lb

## 2016-01-06 DIAGNOSIS — M7712 Lateral epicondylitis, left elbow: Secondary | ICD-10-CM

## 2016-01-06 DIAGNOSIS — M7551 Bursitis of right shoulder: Secondary | ICD-10-CM

## 2016-01-06 NOTE — Progress Notes (Signed)
[facility] [address] [place], [place] [address] Phone: [phone] Subjective:    I'm seeing this patient by the request  of:  Oliver Barre, MD   CC: Right shoulder and left elbow pain  f/u  Jaime Curtis is a 55 y.o. male coming in with complaint of 2 problems. 1. Patient has had more of a right shoulder pain. Cautioned more of a bursitis as well as a labral pathology. Patient elected to have an injection at last follow-up. States that he is a 95% better. Minimal discomfort when he reaches behind his back. No weakness. Overall very happy with the results. Not doing the exercises or any of the medications.  Second problem is more of a left elbow pain. Resolving as well once he started using his shoulder more regularly.       Past Medical History  Diagnosis Date  . ALLERGIC RHINITIS 12/07/2007  . CHEST PAIN 12/07/2007  . DARK URINE 12/07/2007  . HEMATOCHEZIA 10/03/2007  . HYPERLIPIDEMIA 12/07/2007  . NECK PAIN 11/18/2010  . Pure hypercholesterolemia 08/18/2007  . SINUSITIS- ACUTE-NOS 11/25/2009  . URI 11/18/2010  . Bradycardia, sinus 07/10/2011  . Family history of colon cancer 08/11/2013    mother, age 97  . Allergy   . 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 Inguinal herniorrhapy      left  . Tonsillectomy    . Hx of fractured right scapula    . [place] bone anchored hearing aide Right   . Colonoscopy      6-8 years ago   Social History   Social History  . Marital Status: Married    Spouse Name: N/A  . Number of Children: 3  . 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 Smoking status: Never Smoker   . Smokeless tobacco: Never Used  . Alcohol Use: 0.0 oz/week    0 Standard drinks or equivalent per week     Comment: socially  . Drug Use: No  . Sexual Activity: Not Asked    Other Topics Concern  . None   Social History Narrative   Allergies  Allergen Reactions  . Pseudoephedrine    Family History  Problem Relation Age of Onset  . Diabetes Sister   . Cancer Maternal Grandmother     type unknown  . Colon cancer Mother 79    doing well s/p colon surgery  . Rectal cancer Neg Hx   . Stomach cancer Neg Hx     Past medical history, social, surgical and family history all reviewed in electronic medical record.  No pertanent information unless stated regarding to the chief complaint.   Review of Systems: No headache, visual changes, nausea, vomiting, diarrhea, constipation, dizziness, abdominal pain, skin rash, fevers, chills, night sweats, weight loss, swollen lymph nodes,  joint swelling, , chest pain, shortness of breath, mood changes.   Objective Blood pressure 126/76, pulse 52, weight 181 lb (82.101 kg).  General: No apparent distress alert and oriented x3 mood and affect normal, dressed appropriately.  HEENT: Pupils equal, extraocular movements intact  Respiratory: Patient's speak in full sentences and does not appear short of breath  Cardiovascular: No lower extremity edema, non tender, no erythema  Skin: Warm dry intact with no signs of infection or rash on  extremities or on axial skeleton.  Abdomen: Soft nontender  Neuro: Cranial nerves II through XII are intact, neurovascularly intact in all extremities with 2+ DTRs and 2+ pulses.  Lymph: No lymphadenopathy of posterior or anterior cervical chain or axillae bilaterally.  Gait normal with good balance and coordination.  MSK:  Non tender with full range of motion and good stability and symmetric strength and tone of  wrist, hip, knee and ankles bilaterally.  Shoulder: Right Inspection reveals no abnormalities, atrophy or asymmetry. Palpation is normal with no tenderness over AC joint or bicipital groove. ROM is full in all planes passively. Rotator cuff strength normal throughout. Negative  impingement Speeds and Yergason's tests normal. Mild positive O'Brien still remaining Difficult scapular weakness on the right side No painful arc and no drop arm sign. No apprehension sign   Elbow: Left Unremarkable to inspection. Range of motion full pronation, supination, flexion, extension. Strength is full to all of the above directions Stable to varus, valgus stress. Negative moving valgus stress test. Nontender on exam today. Ulnar nerve does not sublux. Negative cubital tunnel Tinel's. Mild improvement from previous exam        Impression and Recommendations:     This case required medical decision making of moderate complexity.      Note: This dictation was prepared with Dragon dictation along with smaller phrase technology. Any transcriptional errors that result from this process are unintentional.

## 2016-01-06 NOTE — Assessment & Plan Note (Signed)
Resolved a changes in ergonomics.

## 2016-01-06 NOTE — Patient Instructions (Signed)
Verbal instructions given

## 2016-01-06 NOTE — Assessment & Plan Note (Signed)
Doing well at this time. Still has a positive O'Brien's which is concerning for labral pathology. Nothing that his stopping him from activities. Encourage him to do the exercises 3 times a week as well as the over-the-counter medications. Patient will follow-up on an as-needed basis.

## 2016-02-19 ENCOUNTER — Telehealth: Payer: Self-pay | Admitting: Internal Medicine

## 2016-02-19 ENCOUNTER — Encounter: Payer: Self-pay | Admitting: Internal Medicine

## 2016-02-19 NOTE — Telephone Encounter (Signed)
Patient Name: Jaime LamSTEVEN Curtis  DOB: December 05, 1960    Initial Comment Caller states has blood in urine    Nurse Assessment  Nurse: Annye Englisharmon, RN, Denise Date/Time (Eastern Time): 02/19/2016 3:39:35 PM  Confirm and document reason for call. If symptomatic, describe symptoms. You must click the next button to save text entered. ---Caller states has blood in urine that started this am after working out. Has some slight lower abd discomfort.  Has the patient traveled out of the country within the last 30 days? ---Not Applicable  Does the patient have any new or worsening symptoms? ---Yes  Will a triage be completed? ---Yes  Related visit to physician within the last 2 weeks? ---No  Does the PT have any chronic conditions? (i.e. diabetes, asthma, etc.) ---No  Is this a behavioral health or substance abuse call? ---No     Guidelines    Guideline Title Affirmed Question Affirmed Notes  Urine - Blood In Blood in urine, but all triage questions negative (Exception: could be normal menstrual bleeding)    Final Disposition User   See Physician within 24 Hours Carmon, RN, Angelique BlonderDenise    Comments  Advised there are no available appts at the [facility] today or tomorrow when viewing schedule. Advised [facility] is available for tx. Advised to drink 8-10, 8oz glasses water/day. Verb understanding.   Referrals  [facility]  [facility]   Disagree/Comply: Comply

## 2016-02-19 NOTE — Telephone Encounter (Signed)
Please advise 

## 2016-02-19 NOTE — Telephone Encounter (Signed)
Ok for OV in may 11 with me addon if still needs eval

## 2016-02-20 ENCOUNTER — Encounter: Payer: Self-pay | Admitting: Internal Medicine

## 2016-02-20 ENCOUNTER — Other Ambulatory Visit (INDEPENDENT_AMBULATORY_CARE_PROVIDER_SITE_OTHER): Payer: BLUE CROSS/BLUE SHIELD

## 2016-02-20 ENCOUNTER — Ambulatory Visit (INDEPENDENT_AMBULATORY_CARE_PROVIDER_SITE_OTHER): Payer: BLUE CROSS/BLUE SHIELD | Admitting: Internal Medicine

## 2016-02-20 VITALS — BP 130/76 | HR 45 | Temp 97.8°F | Resp 20 | Wt 183.0 lb

## 2016-02-20 DIAGNOSIS — R001 Bradycardia, unspecified: Secondary | ICD-10-CM

## 2016-02-20 DIAGNOSIS — R310 Gross hematuria: Secondary | ICD-10-CM | POA: Diagnosis not present

## 2016-02-20 DIAGNOSIS — R319 Hematuria, unspecified: Secondary | ICD-10-CM | POA: Diagnosis not present

## 2016-02-20 LAB — URINALYSIS, ROUTINE W REFLEX MICROSCOPIC
Bilirubin Urine: NEGATIVE
LEUKOCYTES UA: NEGATIVE
NITRITE: NEGATIVE
Specific Gravity, Urine: 1.015 (ref 1.000–1.030)
Total Protein, Urine: NEGATIVE
URINE GLUCOSE: NEGATIVE
Urobilinogen, UA: 0.2 (ref 0.0–1.0)
pH: 5.5 (ref 5.0–8.0)

## 2016-02-20 LAB — BASIC METABOLIC PANEL
BUN: 21 mg/dL (ref 6–23)
CALCIUM: 9.1 mg/dL (ref 8.4–10.5)
CO2: 27 meq/L (ref 19–32)
Chloride: 104 mEq/L (ref 96–112)
Creatinine, Ser: 0.96 mg/dL (ref 0.40–1.50)
GFR: 86.85 mL/min (ref >60.00)
Glucose, Bld: 100 mg/dL — ABNORMAL HIGH (ref 70–99)
POTASSIUM: 4.2 meq/L (ref 3.5–5.1)
SODIUM: 139 meq/L (ref 135–145)

## 2016-02-20 LAB — MICROALBUMIN / CREATININE URINE RATIO
Creatinine,U: 164.1 mg/dL
MICROALB/CREAT RATIO: 0.7 mg/g (ref 0.0–30.0)
Microalb, Ur: 1.1 mg/dL (ref 0.0–1.9)

## 2016-02-20 LAB — POCT URINALYSIS DIPSTICK
BILIRUBIN UA: NEGATIVE
Glucose, UA: NEGATIVE
KETONES UA: NEGATIVE
Leukocytes, UA: NEGATIVE
Nitrite, UA: NEGATIVE
PH UA: 6
PROTEIN UA: NEGATIVE
SPEC GRAV UA: 1.025
Urobilinogen, UA: NEGATIVE

## 2016-02-20 NOTE — Patient Instructions (Signed)
Your specimen will be sent for culture  Please continue all other medications as before, and refills have been done if requested.  Please have the pharmacy call with any other refills you may need.  Please continue your efforts at being more active, low cholesterol diet, and weight control.  You are otherwise up to date with prevention measures today.  Please keep your appointments with your specialists as you may have planned  You will be contacted regarding the referral for: CT scan, and Urology  Please go to the LAB in the Basement (turn left off the elevator) for the tests to be done today  You will be contacted by phone if any changes need to be made immediately.  Otherwise, you will receive a letter about your results with an explanation, but please check with MyChart first.  Please remember to sign up for MyChart if you have not done so, as this will be important to you in the future with finding out test results, communicating by private email, and scheduling acute appointments online when needed.  OK to cancel appt for next week

## 2016-02-20 NOTE — Progress Notes (Signed)
Pre visit review using our clinic review tool, if applicable. No additional management support is needed unless otherwise documented below in the visit note. 

## 2016-02-20 NOTE — Assessment & Plan Note (Signed)
Chronic persistent stable, asympt,  to f/u any worsening symptoms or concerns

## 2016-02-20 NOTE — Assessment & Plan Note (Signed)
Etiology unclear except seems possibly post exercise related, but cant r/o stone, malignancy or infectious; udip reviewed with pt, will send specimen for cx and tx if abnormal , hold antibx for as afeb and benign exam, including GU exam, but for CT Abd/pelvis with CM, also refer urology as likely would benefit from repeat cyst

## 2016-02-20 NOTE — Progress Notes (Signed)
Subjective:    Patient ID: Jaime Curtis, male    DOB: 12-Jun-1961, 55 y.o.   MRN: [id number]  HPI  Here with c/o noticeable gross BRB in urine with the first urination after running on treadmills at the useual pace and distance. Much less the next 2 times, then seemed to clear up after yesterday afternoon.  Denies urinary symptoms such as dysuria, frequency, urgency, flank pain, hematuria or n/v, fever, chills, except for some mild left groin pressure/tenderness this am.. Did have some testicle soreness 4 days ago in the AM on the left, seemed to improve.  S/p bilat inguinal hernia repair at different times, several yrs ago.   Pt denies fever, wt loss, night sweats, loss of appetite, or other constitutional symptoms  No hx of STD's , no penile d/c or rash or ulcer.  Takes asa daily but no other anticoag.   No recent CT abd or urology evaluations (last visit about 12 yrs).  Did have remote episode oif similar symptoms with blood at the time with what sounds like cystocopy.  Pt denies chest pain, increased sob or doe, wheezing, orthopnea, PND, increased LE swelling, palpitations, dizziness or syncope.  Pt denies polydipsia, polyuria  Past Medical History  Diagnosis Date  . ALLERGIC RHINITIS 12/07/2007  . CHEST PAIN 12/07/2007  . DARK URINE 12/07/2007  . HEMATOCHEZIA 10/03/2007  . HYPERLIPIDEMIA 12/07/2007  . NECK PAIN 11/18/2010  . Pure hypercholesterolemia 08/18/2007  . SINUSITIS- ACUTE-NOS 11/25/2009  . URI 11/18/2010  . Bradycardia, sinus 07/10/2011  . Family history of colon cancer 08/11/2013    mother, age 48  . Allergy   . 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 Inguinal herniorrhapy      left  . Tonsillectomy    . Hx of fractured right scapula    . [place] bone anchored hearing aide Right   . Colonoscopy      6-8 years ago    reports that he has never smoked. He has never used smokeless tobacco. He  reports that he drinks alcohol. He reports that he does not use illicit drugs. family history includes Cancer in his maternal grandmother; Colon cancer (age of onset: 66) in his mother; Diabetes in his sister. There is no history of Rectal cancer or Stomach cancer. Allergies  Allergen Reactions  . Pseudoephedrine    Current Outpatient Prescriptions on File Prior to Visit  Medication Sig Dispense Refill  . aspirin 81 MG tablet Take 81 mg by mouth daily.    Marland Kitchen atorvastatin (LIPITOR) 20 MG tablet Take 1 tablet (20 mg total) by mouth daily. 90 tablet 1  . irbesartan (AVAPRO) 75 MG tablet TAKE 1 TABLET (75 MG TOTAL) BY MOUTH DAILY. 90 tablet 3   No current facility-administered medications on file prior to visit.      Review of Systems  Constitutional: Negative for unusual diaphoresis or night sweats HENT: Negative for ear swelling or discharge Eyes: Negative for worsening visual haziness  Respiratory: Negative for choking and stridor.   Gastrointestinal: Negative for distension or worsening eructation Genitourinary: Negative for retention or change in urine volume.  Musculoskeletal: Negative for other MSK pain or swelling Skin: Negative for color change and worsening wound Neurological: Negative for tremors and numbness other than noted  Psychiatric/Behavioral: Negative for decreased concentration or agitation other than above       Objective:  Physical Exam BP 130/76 mmHg  Pulse 45  Temp(Src) 97.8 F (36.6 C) (Oral)  Resp 20  Wt 183 lb (83.008 kg)  SpO2 97% VS noted,  Constitutional: Pt appears in no apparent distress HENT: Head: NCAT.  Right Ear: External ear normal.  Left Ear: External ear normal.  Eyes: . Pupils are equal, round, and reactive to light. Conjunctivae and EOM are normal Neck: Normal range of motion. Neck supple.  Cardiovascular: Normal rate and regular rhythm.   Pulmonary/Chest: Effort normal and breath sounds without rales or wheezing.  Abd:  Soft, NT,  ND, + BS, no flank tender GU: normal male penis, no ulcer, swelling, redness, scrotum and contents without red/tender/.sweling/mass Neurological: Pt is alert. Not confused , motor grossly intact Skin: Skin is warm. No rash, no LE edema Psychiatric: Pt behavior is normal. No agitation.    POCT Urinalysis Dipstick  Status: Finalresult Visible to patient:  Not Released Nextappt: 11/03/2016 at 09:30 AM in Internal Medicine Oliver Barre(Clarence Dunsmore, MD) Dx:  Blood in urine              Ref Range 11:11 AM (02/20/16) 4mo ago (10/28/15) 7535yr ago (10/25/14) 4735yr ago (10/15/14)    Color, UA  yellow       Clarity, UA  cloudy       Glucose, UA  negative       Bilirubin, UA  negative       Ketones, UA  negative       Spec Grav, UA  1.025       Blood, UA  1+       pH, UA  6.0       Protein, UA  negative       Urobilinogen, UA  negative 0.2 0.2 0.2    Nitrite, UA  negative       Leukocytes, UA Negative  Negative           Lab Results  Component Value Date   WBC 7.3 10/28/2015   HGB 15.4 10/28/2015   HCT 46.5 10/28/2015   PLT 205.0 10/28/2015   GLUCOSE 94 10/28/2015   CHOL 144 10/28/2015   TRIG 59.0 10/28/2015   HDL 43.40 10/28/2015   LDLCALC 88 10/28/2015   ALT 21 10/28/2015   AST 21 10/28/2015   NA 143 10/28/2015   K 4.6 10/28/2015   CL 109 10/28/2015   CREATININE 1.04 10/28/2015   BUN 22 10/28/2015   CO2 23 10/28/2015   TSH 1.35 10/28/2015   PSA 0.61 10/28/2015       Assessment & Plan:

## 2016-02-21 LAB — URINE CULTURE
COLONY COUNT: NO GROWTH
Organism ID, Bacteria: NO GROWTH

## 2016-02-25 ENCOUNTER — Ambulatory Visit: Payer: Self-pay | Admitting: Internal Medicine

## 2016-02-25 ENCOUNTER — Ambulatory Visit (INDEPENDENT_AMBULATORY_CARE_PROVIDER_SITE_OTHER)
Admission: RE | Admit: 2016-02-25 | Discharge: 2016-02-25 | Disposition: A | Discharge status: Home / Self Care | Payer: BLUE CROSS/BLUE SHIELD | Source: Ambulatory Visit | Attending: Internal Medicine | Admitting: Internal Medicine

## 2016-02-25 DIAGNOSIS — R310 Gross hematuria: Secondary | ICD-10-CM | POA: Diagnosis not present

## 2016-02-25 DIAGNOSIS — R319 Hematuria, unspecified: Secondary | ICD-10-CM

## 2016-02-25 MED ORDER — IOPAMIDOL (ISOVUE-300) INJECTION 61%
100.0000 mL | Freq: Once | INTRAVENOUS | Status: AC | PRN
  Administered 2016-02-25: 100 mL via INTRAVENOUS

## 2016-02-28 ENCOUNTER — Encounter: Payer: Self-pay | Admitting: Internal Medicine

## 2016-09-30 ENCOUNTER — Ambulatory Visit (INDEPENDENT_AMBULATORY_CARE_PROVIDER_SITE_OTHER): Payer: BLUE CROSS/BLUE SHIELD | Admitting: Sports Medicine

## 2016-09-30 DIAGNOSIS — R269 Unspecified abnormalities of gait and mobility: Secondary | ICD-10-CM | POA: Diagnosis not present

## 2016-10-01 NOTE — Assessment & Plan Note (Addendum)
He has been doing well with no recurrence of his plantar fasciitis or metatarsalgia since being compliant with the orthotics. Custom orthotics were  made today.  Patient was fitted for a standard, cushioned, semi-rigid orthotic. The orthotic was heated and afterward the patient stood on the orthotic blank positioned on the orthotic stand. The patient was positioned in subtalar neutral position and 10 degrees of ankle dorsiflexion in a weight bearing stance. After completion of molding, a stable base was applied to the orthotic blank. The blank was ground to a stable position for weight bearing. Size: size 11 Base: Blue EVA Additional Posting and Padding: Blued padding on Right heel  The patient ambulated these, and they were very comfortable.  I spent 40 minutes with this patient, greater than 50% was face-to-face time counseling regarding the below diagnosis.

## 2016-10-01 NOTE — Progress Notes (Signed)
  Jaime SlatesSteven G Curtis - 55 y.o. male MRN [id number]  Date of birth: [birth year]  SUBJECTIVE:  Including CC & ROS.   Jaime Curtis is a 55 year old male with a history of metatarsalgia and plantar fasciitis with questions about his feet. He reports that he wears his orthotics in his dress years but would like another per for his running shoes. His current orthotics are roughly 54 years old. He denies any pain today. He tends to cross strain, run on the treadmill, and swimming. He is Beginning to start to train for a half marathon. He does have a history of a leg length inequality and has had additional padding placed on his right heel of his custom orthotic.  ROS: No unexpected weight loss, fever, chills, swelling, instability, muscle pain, numbness/tingling, redness, otherwise see HPI    HISTORY: Past Medical, Surgical, Social, and Family History Reviewed & Updated per EMR.   Pertinent Historical Findings include: PMSHx -  hyperlipidemia, sinus bradycardia, tonsillectomy, history of fractured scapula on the right. 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 FHx -  colon cancer Medications -  DATA REVIEWED: None to review  PHYSICAL EXAM:  VS: BP:120/70  HR: bpm  TEMP: ( )  RESP:   HT:5\' 8"  (172.7 cm)   WT:175 lb (79.4 kg)  BMI:26.7 PHYSICAL EXAM: Gen: NAD, alert, cooperative with exam, well-appearing HEENT: clear conjunctiva, EOMI CV:  no edema, capillary refill brisk,  Resp: non-labored, normal speech Skin: no rashes, normal turgor  Neuro: no gross deficits.  Psych:  alert and oriented Feet: Some loss of his longitudinal arch bilaterally. Some loss of his transverse arch. Normal range of motion of ankle. Normal strength. No significant callus formation on the plantar aspect.   ASSESSMENT & PLAN:   Abnormality of gait He has been doing well with no recurrence of his plantar fasciitis or metatarsalgia since being compliant with the orthotics. Custom orthotics were  made today.  Patient  was fitted for a standard, cushioned, semi-rigid orthotic. The orthotic was heated and afterward the patient stood on the orthotic blank positioned on the orthotic stand. The patient was positioned in subtalar neutral position and 10 degrees of ankle dorsiflexion in a weight bearing stance. After completion of molding, a stable base was applied to the orthotic blank. The blank was ground to a stable position for weight bearing. Size: size 11 Base: Blue EVA Additional Posting and Padding: Blued padding on Right heel  The patient ambulated these, and they were very comfortable.  I spent 40 minutes with this patient, greater than 50% was face-to-face time counseling regarding the below diagnosis.

## 2016-10-29 ENCOUNTER — Other Ambulatory Visit: Payer: Self-pay | Admitting: Internal Medicine

## 2016-11-03 ENCOUNTER — Encounter: Payer: BLUE CROSS/BLUE SHIELD | Admitting: Internal Medicine

## 2016-11-16 ENCOUNTER — Other Ambulatory Visit (INDEPENDENT_AMBULATORY_CARE_PROVIDER_SITE_OTHER): Payer: BLUE CROSS/BLUE SHIELD

## 2016-11-16 DIAGNOSIS — Z0000 Encounter for general adult medical examination without abnormal findings: Secondary | ICD-10-CM | POA: Diagnosis not present

## 2016-11-16 LAB — LIPID PANEL
Cholesterol: 143 mg/dL (ref 0–200)
HDL: 41.8 mg/dL (ref >39.00)
LDL CALC: 85 mg/dL (ref 0–99)
NONHDL: 101.21
Total CHOL/HDL Ratio: 3
Triglycerides: 82 mg/dL (ref 0.0–149.0)
VLDL: 16.4 mg/dL (ref 0.0–40.0)

## 2016-11-16 LAB — CBC WITH DIFFERENTIAL/PLATELET
BASOS PCT: 0.8 % (ref 0.0–3.0)
Basophils Absolute: 0.1 10*3/uL (ref 0.0–0.1)
EOS PCT: 3.6 % (ref 0.0–5.0)
Eosinophils Absolute: 0.3 10*3/uL (ref 0.0–0.7)
HCT: 46.2 % (ref 39.0–52.0)
Hemoglobin: 15.5 g/dL (ref 13.0–17.0)
LYMPHS ABS: 1.3 10*3/uL (ref 0.7–4.0)
Lymphocytes Relative: 17.7 % (ref 12.0–46.0)
MCHC: 33.5 g/dL (ref 30.0–36.0)
MCV: 92.2 fl (ref 78.0–100.0)
MONOS PCT: 8 % (ref 3.0–12.0)
Monocytes Absolute: 0.6 10*3/uL (ref 0.1–1.0)
NEUTROS ABS: 5 10*3/uL (ref 1.4–7.7)
NEUTROS PCT: 69.9 % (ref 43.0–77.0)
PLATELETS: 203 10*3/uL (ref 150.0–400.0)
RBC: 5.01 Mil/uL (ref 4.22–5.81)
RDW: 13.5 % (ref 11.5–15.5)
WBC: 7.2 10*3/uL (ref 4.0–10.5)

## 2016-11-16 LAB — BASIC METABOLIC PANEL
BUN: 17 mg/dL (ref 6–23)
CALCIUM: 9.3 mg/dL (ref 8.4–10.5)
CO2: 29 mEq/L (ref 19–32)
Chloride: 108 mEq/L (ref 96–112)
Creatinine, Ser: 1.2 mg/dL (ref 0.40–1.50)
GFR: 66.95 mL/min (ref >60.00)
Glucose, Bld: 92 mg/dL (ref 70–99)
POTASSIUM: 5.1 meq/L (ref 3.5–5.1)
SODIUM: 143 meq/L (ref 135–145)

## 2016-11-16 LAB — HEPATIC FUNCTION PANEL
ALK PHOS: 52 U/L (ref 39–117)
ALT: 15 U/L (ref 0–53)
AST: 15 U/L (ref 0–37)
Albumin: 4.2 g/dL (ref 3.5–5.2)
BILIRUBIN DIRECT: 0.1 mg/dL (ref 0.0–0.3)
Total Bilirubin: 0.5 mg/dL (ref 0.2–1.2)
Total Protein: 6.2 g/dL (ref 6.0–8.3)

## 2016-11-16 LAB — URINALYSIS, ROUTINE W REFLEX MICROSCOPIC
Bilirubin Urine: NEGATIVE
HGB URINE DIPSTICK: NEGATIVE
Ketones, ur: NEGATIVE
LEUKOCYTES UA: NEGATIVE
Nitrite: NEGATIVE
RBC / HPF: NONE SEEN (ref 0–?)
Specific Gravity, Urine: 1.015 (ref 1.000–1.030)
TOTAL PROTEIN, URINE-UPE24: NEGATIVE
Urine Glucose: NEGATIVE
Urobilinogen, UA: 0.2 (ref 0.0–1.0)
WBC, UA: NONE SEEN (ref 0–?)
pH: 6 (ref 5.0–8.0)

## 2016-11-16 LAB — PSA: PSA: 0.73 ng/mL (ref 0.10–4.00)

## 2016-11-16 LAB — TSH: TSH: 1.75 u[IU]/mL (ref 0.35–4.50)

## 2016-11-16 LAB — HEPATITIS C ANTIBODY: HCV AB: NEGATIVE

## 2016-11-19 ENCOUNTER — Encounter: Payer: BLUE CROSS/BLUE SHIELD | Admitting: Internal Medicine

## 2016-12-11 ENCOUNTER — Encounter: Payer: Self-pay | Admitting: Internal Medicine

## 2016-12-11 ENCOUNTER — Ambulatory Visit (INDEPENDENT_AMBULATORY_CARE_PROVIDER_SITE_OTHER): Payer: BLUE CROSS/BLUE SHIELD | Admitting: Internal Medicine

## 2016-12-11 VITALS — BP 142/86 | HR 42 | Temp 97.6°F | Ht 68.0 in | Wt 181.0 lb

## 2016-12-11 DIAGNOSIS — I10 Essential (primary) hypertension: Secondary | ICD-10-CM | POA: Diagnosis not present

## 2016-12-11 DIAGNOSIS — E7800 Pure hypercholesterolemia, unspecified: Secondary | ICD-10-CM | POA: Diagnosis not present

## 2016-12-11 DIAGNOSIS — Z0000 Encounter for general adult medical examination without abnormal findings: Secondary | ICD-10-CM | POA: Diagnosis not present

## 2016-12-11 NOTE — Patient Instructions (Signed)
Please continue all other medications as before, and refills have been done if requested.  Please have the pharmacy call with any other refills you may need.  Please continue your efforts at being more active, low cholesterol diet, and weight control.  You are otherwise up to date with prevention measures today.  Please keep your appointments with your specialists as you may have planned  Please return in 1 year for your yearly visit, or sooner if needed, with Lab testing done 3-5 days before  

## 2016-12-11 NOTE — Progress Notes (Signed)
Subjective:    Patient ID: Jaime Curtis, male    DOB: 07-24-1961, 56 y.o.   MRN: [id number]  HPI  Here for wellness and f/u;  Overall doing ok;  Pt denies Chest pain, worsening SOB, DOE, wheezing, orthopnea, PND, worsening LE edema, palpitations, dizziness or syncope.  Pt denies neurological change such as new headache, facial or extremity weakness.  Pt denies polydipsia, polyuria, or low sugar symptoms. Pt states overall good compliance with treatment and medications, good tolerability, and has been trying to follow appropriate diet.  Pt denies worsening depressive symptoms, suicidal ideation or panic. No fever, night sweats, wt loss, loss of appetite, or other constitutional symptoms.  Pt states good ability with ADL's, has low fall risk, home safety reviewed and adequate, no other significant changes in hearing or vision, and active with exercise with gym use several times per wk.  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  Needs refill avapro as recently ran out. Wt Readings from Last 3 Encounters:  12/11/16 181 lb (82.1 kg)  09/30/16 175 lb (79.4 kg)  02/20/16 183 lb (83 kg)   Past Medical History:  Diagnosis Date  . ALLERGIC RHINITIS 12/07/2007  . Allergy   . Bradycardia, sinus 07/10/2011  . CHEST PAIN 12/07/2007  . DARK URINE 12/07/2007  . Family history of colon cancer 08/11/2013   mother, age 55  . 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  . HEMATOCHEZIA 10/03/2007  . HYPERLIPIDEMIA 12/07/2007  . NECK PAIN 11/18/2010  . Pure hypercholesterolemia 08/18/2007  . SINUSITIS- ACUTE-NOS 11/25/2009  . URI 11/18/2010   Past Surgical History:  Procedure Laterality Date  . [facility] bone anchored hearing aide Right   . COLONOSCOPY     6-8 years ago  . hx of fractured right scapula    . inguinal herniorrhapy     left  . TONSILLECTOMY      reports that he has never smoked. He has never used smokeless tobacco. He reports that he drinks alcohol. He  reports that he does not use drugs. family history includes Cancer in his maternal grandmother; Colon cancer (age of onset: 5982) in his mother; Diabetes in his sister. Allergies  Allergen Reactions  . Pseudoephedrine    Current Outpatient Prescriptions on File Prior to Visit  Medication Sig Dispense Refill  . aspirin 81 MG tablet Take 81 mg by mouth daily.    Marland Kitchen. atorvastatin (LIPITOR) 20 MG tablet Take 1 tablet (20 mg total) by mouth daily. 90 tablet 1  . irbesartan (AVAPRO) 75 MG tablet TAKE 1 TABLET DAILY 90 tablet 0   No current facility-administered medications on file prior to visit.    Review of Systems Constitutional: Negative for increased diaphoresis, or other activity, appetite or siginficant weight change other than noted HENT: Negative for worsening hearing loss, ear pain, facial swelling, mouth sores and neck stiffness.   Eyes: Negative for other worsening pain, redness or visual disturbance.  Respiratory: Negative for choking or stridor Cardiovascular: Negative for other chest pain and palpitations.  Gastrointestinal: Negative for worsening diarrhea, blood in stool, or abdominal distention Genitourinary: Negative for hematuria, flank pain or change in urine volume.  Musculoskeletal: Negative for myalgias or other joint complaints.  Skin: Negative for other color change and wound or drainage.  Neurological: Negative for syncope and numbness. other than noted Hematological: Negative for adenopathy. or other swelling Psychiatric/Behavioral: Negative for hallucinations, SI, self-injury, decreased concentration or other worsening  agitation.  All other system neg per pt    Objective:   Physical Exam BP (!) 142/86   Pulse (!) 42   Temp 97.6 F (36.4 C)   Ht 5\' 8"  (1.727 m)   Wt 181 lb (82.1 kg)   SpO2 97%   BMI 27.52 kg/m  VS noted,  Constitutional: Pt is oriented to person, place, and time. Appears well-developed and well-nourished, in no significant distress Head:  Normocephalic and atraumatic  Eyes: Conjunctivae and EOM are normal. Pupils are equal, round, and reactive to light Right Ear: External ear normal.  Left Ear: External ear normal Nose: Nose normal.  Mouth/Throat: Oropharynx is clear and moist  Neck: Normal range of motion. Neck supple. No JVD present. No tracheal deviation present or significant neck LA or mass Cardiovascular: slow rate, regular rhythm, normal heart sounds and intact distal pulses.   Pulmonary/Chest: Effort normal and breath sounds without rales or wheezing  Abdominal: Soft. Bowel sounds are normal. NT. No HSM  Musculoskeletal: Normal range of motion. Exhibits no edema Lymphadenopathy: Has no cervical adenopathy.  Neurological: Pt is alert and oriented to person, place, and time. Pt has normal reflexes. No cranial nerve deficit. Motor grossly intact Skin: Skin is warm and dry. No rash noted or new ulcers Psychiatric:  Has normal mood and affect. Behavior is normal.   Lab Results  Component Value Date   WBC 7.2 11/16/2016   HGB 15.5 11/16/2016   HCT 46.2 11/16/2016   PLT 203.0 11/16/2016   GLUCOSE 92 11/16/2016   CHOL 143 11/16/2016   TRIG 82.0 11/16/2016   HDL 41.80 11/16/2016   LDLCALC 85 11/16/2016   ALT 15 11/16/2016   AST 15 11/16/2016   NA 143 11/16/2016   K 5.1 11/16/2016   CL 108 11/16/2016   CREATININE 1.20 11/16/2016   BUN 17 11/16/2016   CO2 29 11/16/2016   TSH 1.75 11/16/2016   PSA 0.73 11/16/2016   MICROALBUR 1.1 02/20/2016   ECG today I have personally interpreted Marked sinus  Bradycardia  - 35    Assessment & Plan:

## 2016-12-12 ENCOUNTER — Encounter: Payer: Self-pay | Admitting: Internal Medicine

## 2016-12-12 DIAGNOSIS — I10 Essential (primary) hypertension: Secondary | ICD-10-CM

## 2016-12-12 HISTORY — DX: Essential (primary) hypertension: I10

## 2016-12-12 NOTE — Assessment & Plan Note (Signed)
stable overall by history and exam, recent data reviewed with pt, and pt to continue medical treatment as before,  to f/u any worsening symptoms or concerns BP Readings from Last 3 Encounters:  12/11/16 (!) 142/86  09/30/16 120/70  02/20/16 130/76

## 2016-12-12 NOTE — Assessment & Plan Note (Signed)
stable overall by history and exam, recent data reviewed with pt, and pt to continue medical treatment as before,  to f/u any worsening symptoms or concerns Lab Results  Component Value Date   LDLCALC 85 11/16/2016

## 2016-12-12 NOTE — Assessment & Plan Note (Signed)

## 2016-12-13 ENCOUNTER — Other Ambulatory Visit: Payer: Self-pay | Admitting: Internal Medicine

## 2016-12-20 NOTE — Progress Notes (Signed)
Chief Complaint  Patient presents with  . New Patient (Initial Visit)    sinus bradycardia   History of Present Illness: 56 yo male with history of HTN, HLD and bradycardia here today as a new patient for further evaluation of bradycardia. He has had sinus bradycardia for years. He is very active and exercises every day. He has no dizziness, near syncope or syncope. He has no chest pain or dyspnea. He has been told that his heart rate was slow for his entire adult life. No other cardiac issues. His father died of sudden death at age 44 but the cause of this was not clear. His older sister had Lyme disease and possible MI.   Primary Care Physician: Oliver Barre, MD   Past Medical History:  Diagnosis Date  . ALLERGIC RHINITIS 12/07/2007  . Allergy   . Bradycardia, sinus 07/10/2011  . CHEST PAIN 12/07/2007  . DARK URINE 12/07/2007  . Family history of colon cancer 08/11/2013   mother, age 52  . 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  . HEMATOCHEZIA 10/03/2007  . HYPERLIPIDEMIA 12/07/2007  . Hypertension 12/12/2016  . NECK PAIN 11/18/2010  . Pure hypercholesterolemia 08/18/2007  . SINUSITIS- ACUTE-NOS 11/25/2009  . URI 11/18/2010    Past Surgical History:  Procedure Laterality Date  . [facility] bone anchored hearing aide Right   . COLONOSCOPY     6-8 years ago  . hx of fractured right scapula    . inguinal herniorrhapy Bilateral    left  . TONSILLECTOMY      Current Outpatient Prescriptions  Medication Sig Dispense Refill  . aspirin 81 MG tablet Take 81 mg by mouth daily.    Marland Kitchen atorvastatin (LIPITOR) 20 MG tablet Take 20 mg by mouth daily.    . irbesartan (AVAPRO) 75 MG tablet Take 75 mg by mouth daily.     No current facility-administered medications for this visit.     Allergies  Allergen Reactions  . Pseudoephedrine     Social History   Social History  . Marital status: Married    Spouse name: N/A  . Number of children: 3  . Years of  education: N/A   Occupational History  . Field seismologist    Social History Main Topics  . Smoking status: Never Smoker  . Smokeless tobacco: Never Used  . Alcohol use 0.0 oz/week     Comment: socially  . Drug use: No  . Sexual activity: Not on file   Other Topics Concern  . Not on file   Social History Narrative  . No narrative on file    Family History  Problem Relation Age of Onset  . Colon cancer Mother 100    doing well s/p colon surgery  . Sudden death Father   . Diabetes Sister   . Cancer Maternal Grandmother     type unknown  . Heart attack Sister   . Rectal cancer Neg Hx   . Stomach cancer Neg Hx     Review of Systems:  As stated in the HPI and otherwise negative.   BP (!) 112/53   Pulse 66   Ht 5\' 8"  (1.727 m)   Wt 180 lb (81.6 kg)   BMI 27.37 kg/m   Physical Examination: General: Well developed, well nourished, NAD  HEENT: OP clear, mucus membranes moist  SKIN: warm, dry. No rashes. Neuro: No focal deficits  Musculoskeletal: Muscle strength 5/5 all ext  Psychiatric:  Mood and affect normal  Neck: No JVD, no carotid bruits, no thyromegaly, no lymphadenopathy.  Lungs:Clear bilaterally, no wheezes, rhonci, crackles Cardiovascular: Regular rate and rhythm. No murmurs, gallops or rubs. Abdomen:Soft. Bowel sounds present. Non-tender.  Extremities: No lower extremity edema. Pulses are 2 + in the bilateral DP/PT.  EKG:  EKG is not ordered today. The ekg from 12/11/16 shows sinus bradycardia with HR of 35 bpm.   Recent Labs: 11/16/2016: ALT 15; BUN 17; Creatinine, Ser 1.20; Hemoglobin 15.5; Platelets 203.0; Potassium 5.1; Sodium 143; TSH 1.75   Lipid Panel    Component Value Date/Time   CHOL 143 11/16/2016 0729   TRIG 82.0 11/16/2016 0729   HDL 41.80 11/16/2016 0729   CHOLHDL 3 11/16/2016 0729   VLDL 16.4 11/16/2016 0729   LDLCALC 85 11/16/2016 0729     Wt Readings from Last 3 Encounters:  12/21/16 180 lb (81.6 kg)  12/11/16 181 lb (82.1 kg)    09/30/16 175 lb (79.4 kg)     Other studies Reviewed: Additional studies/ records that were reviewed today include: . Review of the above records demonstrates:   Assessment and Plan:   1. Sinus bradycardia: He has sinus bradycardia but is asymptomatic. He is very active. Given his age, I will arrange an exercise stress test to see his heart rate response. I will also arrange an echo to assess LVEF and exclude structural heart disease.    Current medicines are reviewed at length with the patient today.  The patient does not have concerns regarding medicines.  The following changes have been made:  no change  Labs/ tests ordered today include:   Orders Placed This Encounter  Procedures  . Exercise Tolerance Test  . ECHOCARDIOGRAM COMPLETE    Disposition:   FU with me in 2-3 years.   Signed, Verne Carrowhristopher McAlhany, MD 12/21/2016 8:41 AM    [facility] [address], [place], [place]  [address] Phone: [phone]; Fax: [phone]

## 2016-12-21 ENCOUNTER — Ambulatory Visit (INDEPENDENT_AMBULATORY_CARE_PROVIDER_SITE_OTHER): Payer: BLUE CROSS/BLUE SHIELD | Admitting: Cardiovascular Disease

## 2016-12-21 ENCOUNTER — Encounter: Payer: Self-pay | Admitting: Cardiovascular Disease

## 2016-12-21 VITALS — BP 112/53 | HR 66 | Ht 68.0 in | Wt 180.0 lb

## 2016-12-21 DIAGNOSIS — R001 Bradycardia, unspecified: Secondary | ICD-10-CM | POA: Diagnosis not present

## 2016-12-21 NOTE — Patient Instructions (Signed)
Medication Instructions:  Your physician recommends that you continue on your current medications as directed. Please refer to the Current Medication list given to you today.  Labwork: No new orders.   Testing/Procedures: Your physician has requested that you have an echocardiogram. Echocardiography is a painless test that uses sound waves to create images of your heart. It provides your doctor with information about the size and shape of your heart and how well your heart's chambers and valves are working. This procedure takes approximately one hour. There are no restrictions for this procedure.  Your physician has requested that you have an exercise tolerance test. For further information please visit [email or web address]. Please also follow instruction sheet, as given.  Follow-Up: Your physician wants you to follow-up in: 3 YEARS Dr Clifton JamesMcAlhany.  You will receive a reminder letter in the mail two months in advance. If you don't receive a letter, please call our office to schedule the follow-up appointment.   Any Other Special Instructions Will Be Listed Below (If Applicable).     If you need a refill on your cardiac medications before your next appointment, please call your pharmacy.

## 2017-01-06 ENCOUNTER — Ambulatory Visit (INDEPENDENT_AMBULATORY_CARE_PROVIDER_SITE_OTHER): Payer: BLUE CROSS/BLUE SHIELD

## 2017-01-06 ENCOUNTER — Ambulatory Visit (HOSPITAL_COMMUNITY): Payer: BLUE CROSS/BLUE SHIELD | Attending: Cardiovascular Disease

## 2017-01-06 ENCOUNTER — Other Ambulatory Visit: Payer: Self-pay

## 2017-01-06 DIAGNOSIS — R001 Bradycardia, unspecified: Secondary | ICD-10-CM

## 2017-01-06 DIAGNOSIS — I420 Dilated cardiomyopathy: Secondary | ICD-10-CM | POA: Diagnosis not present

## 2017-01-06 DIAGNOSIS — I517 Cardiomegaly: Secondary | ICD-10-CM | POA: Insufficient documentation

## 2017-01-06 LAB — EXERCISE TOLERANCE TEST
CHL CUP MPHR: 166 {beats}/min
CHL CUP RESTING HR STRESS: 41 {beats}/min
CSEPEDS: 34 s
CSEPEW: 16.3 METS
CSEPPHR: 153 {beats}/min
Exercise duration (min): 13 min
Percent HR: 92 %
RPE: 18

## 2017-01-27 ENCOUNTER — Other Ambulatory Visit: Payer: Self-pay | Admitting: Internal Medicine

## 2017-06-23 DIAGNOSIS — K3580 Unspecified acute appendicitis: Secondary | ICD-10-CM | POA: Diagnosis not present

## 2017-09-08 ENCOUNTER — Other Ambulatory Visit: Payer: Self-pay | Admitting: Internal Medicine

## 2017-10-25 ENCOUNTER — Encounter: Payer: Self-pay | Admitting: Internal Medicine

## 2017-11-20 ENCOUNTER — Encounter: Payer: Self-pay | Admitting: Internal Medicine

## 2017-11-20 ENCOUNTER — Ambulatory Visit: Payer: BLUE CROSS/BLUE SHIELD | Admitting: Internal Medicine

## 2017-11-20 VITALS — BP 114/74 | HR 48 | Temp 98.0°F | Resp 16 | Wt 182.0 lb

## 2017-11-20 DIAGNOSIS — R509 Fever, unspecified: Secondary | ICD-10-CM | POA: Diagnosis not present

## 2017-11-20 DIAGNOSIS — J101 Influenza due to other identified influenza virus with other respiratory manifestations: Secondary | ICD-10-CM

## 2017-11-20 DIAGNOSIS — R5383 Other fatigue: Secondary | ICD-10-CM | POA: Diagnosis not present

## 2017-11-20 LAB — POC INFLUENZA A&B (BINAX/QUICKVUE)
Influenza A, POC: POSITIVE — AB
Influenza B, POC: NEGATIVE

## 2017-11-20 MED ORDER — OSELTAMIVIR PHOSPHATE 75 MG PO CAPS: 75.0000 mg | ORAL_CAPSULE | Freq: Two times a day (BID) | ORAL | 0 refills | Status: DC

## 2017-11-20 NOTE — Progress Notes (Signed)
Chief Complaint  Patient presents with  . Follow-up   Sick visit f/o chills, fatigue, cough, ? Fever has not checked, no body aches, +h/a, no sore throat    Review of Systems  Constitutional: Negative for fever.  HENT:       +runny nose   Respiratory: Positive for cough.   Cardiovascular: Negative for chest pain.  Gastrointestinal: Negative for nausea and vomiting.  Musculoskeletal: Positive for myalgias.  Skin: Positive for rash.   Past Medical History:  Diagnosis Date  . ALLERGIC RHINITIS 12/07/2007  . Allergy   . Bradycardia, sinus 07/10/2011  . CHEST PAIN 12/07/2007  . DARK URINE 12/07/2007  . Family history of colon cancer 08/11/2013   mother, age 990  . 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  . HEMATOCHEZIA 10/03/2007  . HYPERLIPIDEMIA 12/07/2007  . Hypertension 12/12/2016  . NECK PAIN 11/18/2010  . Pure hypercholesterolemia 08/18/2007  . SINUSITIS- ACUTE-NOS 11/25/2009  . URI 11/18/2010   Past Surgical History:  Procedure Laterality Date  . [place] bone anchored hearing aide Right   . COLONOSCOPY     6-8 years ago  . hx of fractured right scapula    . inguinal herniorrhapy Bilateral    left  . TONSILLECTOMY     Family History  Problem Relation Age of Onset  . Colon cancer Mother 2682       doing well s/p colon surgery  . Sudden death Father   . Diabetes Sister   . Cancer Maternal Grandmother        type unknown  . Heart attack Sister   . Rectal cancer Neg Hx   . Stomach cancer Neg Hx    Social History   Socioeconomic History  . Marital status: Married    Spouse name: Not on file  . Number of children: 3  . Years of education: Not on file  . Highest education level: Not on file  Social Needs  . Financial resource strain: Not on file  . Food insecurity - worry: Not on file  . Food insecurity - inability: Not on file  . Transportation needs - medical: Not on file  . Transportation needs - non-medical: Not on file   Occupational History  . Occupation: Field seismologistngineer [employer]  Tobacco Use  . Smoking status: Never Smoker  . Smokeless tobacco: Never Used  Substance and Sexual Activity  . Alcohol use: Yes    Alcohol/week: 0.0 oz    Comment: socially  . Drug use: No  . Sexual activity: Not on file  Other Topics Concern  . Not on file  Social History Narrative  . Not on file   Current Meds  Medication Sig  . aspirin 81 MG tablet Take 81 mg by mouth daily.  Marland Kitchen. atorvastatin (LIPITOR) 20 MG tablet TAKE 1 TABLET DAILY  . irbesartan (AVAPRO) 75 MG tablet Take 1 tablet (75 mg total) by mouth daily.   Allergies  Allergen Reactions  . Pseudoephedrine    Recent Results (from the past 2160 hour(s))  POC Influenza A&B (Binax test)     Status: Abnormal   Collection Time: 11/20/17  9:39 AM  Result Value Ref Range   Influenza A, POC Positive (A) Negative   Influenza B, POC Negative Negative   Objective  Body mass index is 27.67 kg/m. Wt Readings from Last 3 Encounters:  11/20/17 182 lb (82.6 kg)  12/21/16 180 lb (81.6 kg)  12/11/16 181 lb (82.1 kg)  Temp Readings from Last 3 Encounters:  11/20/17 98 F (36.7 C) (Oral)  12/11/16 97.6 F (36.4 C)  02/20/16 97.8 F (36.6 C) (Oral)   BP Readings from Last 3 Encounters:  11/20/17 114/74  12/21/16 (!) 112/53  12/11/16 (!) 142/86   Pulse Readings from Last 3 Encounters:  11/20/17 (!) 48  12/21/16 66  12/11/16 (!) 42   O2 sat room air 98%   Physical Exam  Constitutional: He is oriented to person, place, and time and well-developed, well-nourished, and in no distress.  HENT:  Head: Normocephalic and atraumatic.  Eyes: Conjunctivae are normal. Pupils are equal, round, and reactive to light.  Cardiovascular: Normal rate, regular rhythm and normal heart sounds.  Pulmonary/Chest: Effort normal and breath sounds normal.  Neurological: He is alert and oriented to person, place, and time. Gait normal.  Skin: Skin is warm and dry.  Psychiatric: Mood,  memory, affect and judgment normal.  Nursing note and vitals reviewed.   Assessment   1. Flu A + Plan  1. Supportive care  tamiflu bid x 5 days Ed or PCP prn  Provider: Dr. French Ana McLean-Scocuzza-[facility]

## 2017-11-20 NOTE — Patient Instructions (Signed)
Go to ED or f/u with PCP next week if not better   Influenza, Adult Influenza, more commonly known as "the flu," is a viral infection that primarily affects the respiratory tract. The respiratory tract includes organs that help you breathe, such as the lungs, nose, and throat. The flu causes many common cold symptoms, as well as a high fever and body aches. The flu spreads easily from person to person (is contagious). Getting a flu shot (influenza vaccination) every year is the best way to prevent influenza. What are the causes? Influenza is caused by a virus. You can catch the virus by:  Breathing in droplets from an infected person's cough or sneeze.  Touching something that was recently contaminated with the virus and then touching your mouth, nose, or eyes.  What increases the risk? The following factors may make you more likely to get the flu:  Not cleaning your hands frequently with soap and water or alcohol-based hand sanitizer.  Having close contact with many people during cold and flu season.  Touching your mouth, eyes, or nose without washing or sanitizing your hands first.  Not drinking enough fluids or not eating a healthy diet.  Not getting enough sleep or exercise.  Being under a high amount of stress.  Not getting a yearly (annual) flu shot.  You may be at a higher risk of complications from the flu, such as a severe lung infection (pneumonia), if you:  Are over the age of 56.  Are pregnant.  Have a weakened disease-fighting system (immune system). You may have a weakened immune system if you: ? Have HIV or AIDS. ? Are undergoing chemotherapy. ? Aretaking medicines that reduce the activity of (suppress) the immune system.  Have a long-term (chronic) illness, such as heart disease, kidney disease, diabetes, or lung disease.  Have a liver disorder.  Are obese.  Have anemia.  What are the signs or symptoms? Symptoms of this condition typically last 4-10  days and may include:  Fever.  Chills.  Headache, body aches, or muscle aches.  Sore throat.  Cough.  Runny or congested nose.  Chest discomfort and cough.  Poor appetite.  Weakness or tiredness (fatigue).  Dizziness.  Nausea or vomiting.  How is this diagnosed? This condition may be diagnosed based on your medical history and a physical exam. Your health care provider may do a nose or throat swab test to confirm the diagnosis. How is this treated? If influenza is detected early, you can be treated with antiviral medicine that can reduce the length of your illness and the severity of your symptoms. This medicine may be given by mouth (orally) or through an IV tube that is inserted in one of your veins. The goal of treatment is to relieve symptoms by taking care of yourself at home. This may include taking over-the-counter medicines, drinking plenty of fluids, and adding humidity to the air in your home. In some cases, influenza goes away on its own. Severe influenza or complications from influenza may be treated in a hospital. Follow these instructions at home:  Take over-the-counter and prescription medicines only as told by your health care provider.  Use a cool mist humidifier to add humidity to the air in your home. This can make breathing easier.  Rest as needed.  Drink enough fluid to keep your urine clear or pale yellow.  Cover your mouth and nose when you cough or sneeze.  Wash your hands with soap and water often, especially after  you cough or sneeze. If soap and water are not available, use hand sanitizer.  Stay home from work or school as told by your health care provider. Unless you are visiting your health care provider, try to avoid leaving home until your fever has been gone for 24 hours without the use of medicine.  Keep all follow-up visits as told by your health care provider. This is important. How is this prevented?  Getting an annual flu shot is  the best way to avoid getting the flu. You may get the flu shot in late summer, fall, or winter. Ask your health care provider when you should get your flu shot.  Wash your hands often or use hand sanitizer often.  Avoid contact with people who are sick during cold and flu season.  Eat a healthy diet, drink plenty of fluids, get enough sleep, and exercise regularly. Contact a health care provider if:  You develop new symptoms.  You have: ? Chest pain. ? Diarrhea. ? A fever.  Your cough gets worse.  You produce more mucus.  You feel nauseous or you vomit. Get help right away if:  You develop shortness of breath or difficulty breathing.  Your skin or nails turn a bluish color.  You have severe pain or stiffness in your neck.  You develop a sudden headache or sudden pain in your face or ear.  You cannot stop vomiting. This information is not intended to replace advice given to you by your health care provider. Make sure you discuss any questions you have with your health care provider. Document Released: 09/25/2000 Document Revised: 03/05/2016 Document Reviewed: 07/23/2015 Elsevier Interactive Patient Education  2017 [employer].

## 2017-11-26 ENCOUNTER — Encounter: Payer: Self-pay | Admitting: Internal Medicine

## 2017-11-26 ENCOUNTER — Ambulatory Visit: Payer: BLUE CROSS/BLUE SHIELD | Admitting: Internal Medicine

## 2017-11-26 ENCOUNTER — Ambulatory Visit (INDEPENDENT_AMBULATORY_CARE_PROVIDER_SITE_OTHER)
Admission: RE | Admit: 2017-11-26 | Discharge: 2017-11-26 | Disposition: A | Discharge status: Home / Self Care | Payer: BLUE CROSS/BLUE SHIELD | Source: Ambulatory Visit | Attending: Internal Medicine | Admitting: Internal Medicine

## 2017-11-26 VITALS — BP 132/82 | HR 60 | Temp 97.5°F | Ht 68.0 in | Wt 178.0 lb

## 2017-11-26 DIAGNOSIS — R059 Cough, unspecified: Secondary | ICD-10-CM

## 2017-11-26 DIAGNOSIS — I10 Essential (primary) hypertension: Secondary | ICD-10-CM | POA: Diagnosis not present

## 2017-11-26 DIAGNOSIS — R05 Cough: Secondary | ICD-10-CM

## 2017-11-26 DIAGNOSIS — R062 Wheezing: Secondary | ICD-10-CM

## 2017-11-26 MED ORDER — LEVOFLOXACIN 500 MG PO TABS: 500.0000 mg | ORAL_TABLET | Freq: Every day | ORAL | 0 refills | Status: DC

## 2017-11-26 MED ORDER — PREDNISONE 10 MG PO TABS: ORAL_TABLET | ORAL | 0 refills | Status: DC

## 2017-11-26 MED ORDER — HYDROCODONE-HOMATROPINE 5-1.5 MG/5ML PO SYRP: 5.0000 mL | ORAL_SOLUTION | Freq: Four times a day (QID) | ORAL | 0 refills | Status: DC | PRN

## 2017-11-26 MED ORDER — LEVOFLOXACIN 500 MG PO TABS: 500.0000 mg | ORAL_TABLET | Freq: Every day | ORAL | 0 refills | Status: AC

## 2017-11-26 MED ORDER — HYDROCODONE-HOMATROPINE 5-1.5 MG/5ML PO SYRP: 5.0000 mL | ORAL_SOLUTION | Freq: Four times a day (QID) | ORAL | 0 refills | Status: AC | PRN

## 2017-11-26 MED ORDER — PREDNISONE 10 MG PO TABS
ORAL_TABLET | ORAL | 0 refills | Status: DC
Start: 2017-11-26 — End: 2017-11-26

## 2017-11-26 NOTE — Progress Notes (Signed)
Subjective:    Patient ID: Jaime Curtis, male    DOB: May 17, 1961, 57 y.o.   MRN: [id number]  HPI  Here with acute onset mild to mod 5-6 days ST, HA, general weakness and malaise, with prod cough greenish sputum, but Pt denies chest pain, increased sob or doe, wheezing, orthopnea, PND, increased LE swelling, palpitations, dizziness or syncope.  Son + for flu and now hospd with respiratory complications.  Pts own symptoms had been better after 2 days on tamiflu but now worsening as above.   Past Medical History:  Diagnosis Date  . ALLERGIC RHINITIS 12/07/2007  . Allergy   . Bradycardia, sinus 07/10/2011  . CHEST PAIN 12/07/2007  . DARK URINE 12/07/2007  . Family history of colon cancer 08/11/2013   mother, age 56  . 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  . HEMATOCHEZIA 10/03/2007  . HYPERLIPIDEMIA 12/07/2007  . Hypertension 12/12/2016  . NECK PAIN 11/18/2010  . Pure hypercholesterolemia 08/18/2007  . SINUSITIS- ACUTE-NOS 11/25/2009  . URI 11/18/2010   Past Surgical History:  Procedure Laterality Date  . [employer] bone anchored hearing aide Right   . COLONOSCOPY     6-8 years ago  . hx of fractured right scapula    . inguinal herniorrhapy Bilateral    left  . TONSILLECTOMY      reports that  has never smoked. he has never used smokeless tobacco. He reports that he drinks alcohol. He reports that he does not use drugs. family history includes Cancer in his maternal grandmother; Colon cancer (age of onset: 1682) in his mother; Diabetes in his sister; Heart attack in his sister; Sudden death in his father. Allergies  Allergen Reactions  . Pseudoephedrine    Current Outpatient Medications on File Prior to Visit  Medication Sig Dispense Refill  . aspirin 81 MG tablet Take 81 mg by mouth daily.    Marland Kitchen. atorvastatin (LIPITOR) 20 MG tablet TAKE 1 TABLET DAILY 90 tablet 1  . irbesartan (AVAPRO) 75 MG tablet Take 1 tablet (75 mg total) by mouth daily. 90 tablet 3  .  oseltamivir (TAMIFLU) 75 MG capsule Take 1 capsule (75 mg total) by mouth 2 (two) times daily. 10 capsule 0   No current facility-administered medications on file prior to visit.    Review of Systems  Constitutional: Negative for other unusual diaphoresis or sweats HENT: Negative for ear discharge or swelling Eyes: Negative for other worsening visual disturbances Respiratory: Negative for stridor or other swelling  Gastrointestinal: Negative for worsening distension or other blood Genitourinary: Negative for retention or other urinary change Musculoskeletal: Negative for other MSK pain or swelling Skin: Negative for color change or other new lesions Neurological: Negative for worsening tremors and other numbness  Psychiatric/Behavioral: Negative for worsening agitation or other fatigue All other system neg per pt    Objective:   Physical Exam BP 132/82   Pulse 60   Temp (!) 97.5 F (36.4 C) (Oral)   Ht 5\' 8"  (1.727 m)   Wt 178 lb (80.7 kg)   SpO2 97%   BMI 27.06 kg/m  VS noted, mild ill Constitutional: Pt appears in NAD HENT: Head: NCAT.  Right Ear: External ear normal.  Left Ear: External ear normal.  Eyes: . Pupils are equal, round, and reactive to light. Conjunctivae and EOM are normal Bilat tm's with mild erythema.  Max sinus areas non tender.  Pharynx with mild erythema, no exudate Nose: without  d/c or deformity Neck: Neck supple. Gross normal ROM Cardiovascular: Normal rate and regular rhythm.   Pulmonary/Chest: Effort normal and breath sounds decreased bilat with course BS, rhonchi and occas whezee Neurological: Pt is alert. At baseline orientation, motor grossly intact Skin: Skin is warm. No rashes, other new lesions, no LE edema Psychiatric: Pt behavior is normal without agitation  No other exam findings     Assessment & Plan:

## 2017-11-26 NOTE — Assessment & Plan Note (Signed)
stable overall by history and exam, recent data reviewed with pt, and pt to continue medical treatment as before,  to f/u any worsening symptoms or concerns BP Readings from Last 3 Encounters:  11/26/17 132/82  11/20/17 114/74  12/21/16 (!) 112/53

## 2017-11-26 NOTE — Assessment & Plan Note (Signed)
Mild to mod, c/w bronchitis vs pna, declines cxr for now, for antibx course,  to f/u any worsening symptoms or concerns

## 2017-11-26 NOTE — Patient Instructions (Signed)
Please take all new medication as prescribed - the antibiotic, cough medicine if needed, and prednisone  Please continue all other medications as before, and refills have been done if requested.  Please have the pharmacy call with any other refills you may need.  Please keep your appointments with your specialists as you may have planned  Please go to the XRAY Department in the Basement (go straight as you get off the elevator) for the x-ray testing  You will be contacted by phone if any changes need to be made immediately.  Otherwise, you will receive a letter about your results with an explanation, but please check with MyChart first.  Please remember to sign up for MyChart if you have not done so, as this will be important to you in the future with finding out test results, communicating by private email, and scheduling acute appointments online when needed.     

## 2017-11-26 NOTE — Assessment & Plan Note (Signed)
/  Mild to mod, for predpac asd,  to f/u any worsening symptoms or concerns 

## 2017-11-28 ENCOUNTER — Encounter: Payer: Self-pay | Admitting: Internal Medicine

## 2017-12-10 ENCOUNTER — Other Ambulatory Visit (INDEPENDENT_AMBULATORY_CARE_PROVIDER_SITE_OTHER): Payer: BLUE CROSS/BLUE SHIELD

## 2017-12-10 DIAGNOSIS — Z0000 Encounter for general adult medical examination without abnormal findings: Secondary | ICD-10-CM | POA: Diagnosis not present

## 2017-12-10 LAB — URINALYSIS, ROUTINE W REFLEX MICROSCOPIC
Bilirubin Urine: NEGATIVE
Hgb urine dipstick: NEGATIVE
Ketones, ur: NEGATIVE
LEUKOCYTES UA: NEGATIVE
NITRITE: NEGATIVE
SPECIFIC GRAVITY, URINE: 1.01 (ref 1.000–1.030)
Total Protein, Urine: NEGATIVE
Urine Glucose: NEGATIVE
Urobilinogen, UA: 0.2 (ref 0.0–1.0)
WBC UA: NONE SEEN — AB (ref 0–?)
pH: 7 (ref 5.0–8.0)

## 2017-12-10 LAB — LIPID PANEL
Cholesterol: 146 mg/dL (ref 0–200)
HDL: 38.4 mg/dL — ABNORMAL LOW (ref >39.00)
LDL Cholesterol: 94 mg/dL (ref 0–99)
NonHDL: 107.78
Total CHOL/HDL Ratio: 4
Triglycerides: 70 mg/dL (ref 0.0–149.0)
VLDL: 14 mg/dL (ref 0.0–40.0)

## 2017-12-10 LAB — CBC WITH DIFFERENTIAL/PLATELET
Basophils Absolute: 0.1 10*3/uL (ref 0.0–0.1)
Basophils Relative: 0.7 % (ref 0.0–3.0)
Eosinophils Absolute: 0.1 10*3/uL (ref 0.0–0.7)
Eosinophils Relative: 1 % (ref 0.0–5.0)
HCT: 43.5 % (ref 39.0–52.0)
Hemoglobin: 14.9 g/dL (ref 13.0–17.0)
Lymphocytes Relative: 16.3 % (ref 12.0–46.0)
Lymphs Abs: 1.4 10*3/uL (ref 0.7–4.0)
MCHC: 34.3 g/dL (ref 30.0–36.0)
MCV: 91.4 fl (ref 78.0–100.0)
Monocytes Absolute: 0.6 10*3/uL (ref 0.1–1.0)
Monocytes Relative: 6.8 % (ref 3.0–12.0)
Neutro Abs: 6.6 10*3/uL (ref 1.4–7.7)
Neutrophils Relative %: 75.2 % (ref 43.0–77.0)
Platelets: 201 10*3/uL (ref 150.0–400.0)
RBC: 4.76 Mil/uL (ref 4.22–5.81)
RDW: 13.7 % (ref 11.5–15.5)
WBC: 8.8 10*3/uL (ref 4.0–10.5)

## 2017-12-10 LAB — HEPATIC FUNCTION PANEL
ALBUMIN: 3.7 g/dL (ref 3.5–5.2)
ALT: 14 U/L (ref 0–53)
AST: 13 U/L (ref 0–37)
Alkaline Phosphatase: 49 U/L (ref 39–117)
BILIRUBIN TOTAL: 0.9 mg/dL (ref 0.2–1.2)
Bilirubin, Direct: 0.2 mg/dL (ref 0.0–0.3)
Total Protein: 6.1 g/dL (ref 6.0–8.3)

## 2017-12-10 LAB — BASIC METABOLIC PANEL
BUN: 18 mg/dL (ref 6–23)
CALCIUM: 9.3 mg/dL (ref 8.4–10.5)
CO2: 29 mEq/L (ref 19–32)
Chloride: 106 mEq/L (ref 96–112)
Creatinine, Ser: 1.1 mg/dL (ref 0.40–1.50)
GFR: 73.73 mL/min (ref >60.00)
GLUCOSE: 87 mg/dL (ref 70–99)
Potassium: 4.3 mEq/L (ref 3.5–5.1)
Sodium: 141 mEq/L (ref 135–145)

## 2017-12-10 LAB — PSA: PSA: 0.66 ng/mL (ref 0.10–4.00)

## 2017-12-10 LAB — TSH: TSH: 0.89 u[IU]/mL (ref 0.35–4.50)

## 2017-12-10 NOTE — Addendum Note (Signed)
Addended by: Clarice PoleHINESLEY, Arloa Prak M on: 12/10/2017 10:17 AM   Modules accepted: Orders

## 2017-12-11 LAB — HIV ANTIBODY (ROUTINE TESTING W REFLEX): HIV 1&2 Ab, 4th Generation: NONREACTIVE

## 2017-12-15 ENCOUNTER — Encounter: Payer: Self-pay | Admitting: Internal Medicine

## 2017-12-15 ENCOUNTER — Ambulatory Visit (INDEPENDENT_AMBULATORY_CARE_PROVIDER_SITE_OTHER): Payer: BLUE CROSS/BLUE SHIELD | Admitting: Internal Medicine

## 2017-12-15 VITALS — BP 116/76 | HR 45 | Temp 98.5°F | Ht 68.0 in | Wt 180.0 lb

## 2017-12-15 DIAGNOSIS — Z0000 Encounter for general adult medical examination without abnormal findings: Secondary | ICD-10-CM | POA: Diagnosis not present

## 2017-12-15 DIAGNOSIS — I10 Essential (primary) hypertension: Secondary | ICD-10-CM | POA: Diagnosis not present

## 2017-12-15 MED ORDER — IRBESARTAN 75 MG PO TABS: 75.0000 mg | ORAL_TABLET | Freq: Every day | ORAL | 3 refills | Status: DC

## 2017-12-15 MED ORDER — ATORVASTATIN CALCIUM 20 MG PO TABS: 20.0000 mg | ORAL_TABLET | Freq: Every day | ORAL | 3 refills | Status: DC

## 2017-12-15 NOTE — Patient Instructions (Signed)
Please continue all other medications as before, and refills have been done if requested.  Please have the pharmacy call with any other refills you may need.  Please continue your efforts at being more active, low cholesterol diet, and weight control.  You are otherwise up to date with prevention measures today.  Please keep your appointments with your specialists as you may have planned  Please return in 1 year for your yearly visit, or sooner if needed, with Lab testing done 3-5 days before  

## 2017-12-15 NOTE — Progress Notes (Signed)
Subjective:    Patient ID: Jaime Curtis, male    DOB: Jun 22, 1961, 57 y.o.   MRN: [id number]  HPI  Here for wellness and f/u;  Overall doing ok;  Pt denies Chest pain, worsening SOB, DOE, wheezing, orthopnea, PND, worsening LE edema, palpitations, dizziness or syncope.  Pt denies neurological change such as new headache, facial or extremity weakness.  Pt denies polydipsia, polyuria, or low sugar symptoms. Pt states overall good compliance with treatment and medications, good tolerability, and has been trying to follow appropriate diet.  Pt denies worsening depressive symptoms, suicidal ideation or panic. No fever, night sweats, wt loss, loss of appetite, or other constitutional symptoms.  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  Had recent URI symtpoms last wk now resolved. No interval hx or new complaints Past Medical History:  Diagnosis Date  . ALLERGIC RHINITIS 12/07/2007  . Allergy   . Bradycardia, sinus 07/10/2011  . CHEST PAIN 12/07/2007  . DARK URINE 12/07/2007  . Family history of colon cancer 08/11/2013   mother, age 25  . 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  . HEMATOCHEZIA 10/03/2007  . HYPERLIPIDEMIA 12/07/2007  . Hypertension 12/12/2016  . NECK PAIN 11/18/2010  . Pure hypercholesterolemia 08/18/2007  . SINUSITIS- ACUTE-NOS 11/25/2009  . URI 11/18/2010   Past Surgical History:  Procedure Laterality Date  . [employer] bone anchored hearing aide Right   . COLONOSCOPY     6-8 years ago  . hx of fractured right scapula    . inguinal herniorrhapy Bilateral    left  . TONSILLECTOMY      reports that  has never smoked. he has never used smokeless tobacco. He reports that he drinks alcohol. He reports that he does not use  drugs. family history includes Cancer in his maternal grandmother; Colon cancer (age of onset: 53) in his mother; Diabetes in his sister; Heart attack in his sister; Sudden death in his father. Allergies  Allergen Reactions  . Pseudoephedrine    Current Outpatient Medications on File Prior to Visit  Medication Sig Dispense Refill  . aspirin 81 MG tablet Take 81 mg by mouth daily.     No current facility-administered medications on file prior to visit.    Review of Systems Constitutional: Negative for other unusual diaphoresis, sweats, appetite or weight changes HENT: Negative for other worsening hearing loss, ear pain, facial swelling, mouth sores or neck stiffness.   Eyes: Negative for other worsening pain, redness or other visual disturbance.  Respiratory: Negative for other stridor or swelling Cardiovascular: Negative for other palpitations or other chest pain  Gastrointestinal: Negative for worsening diarrhea or loose stools, blood in stool, distention or other pain Genitourinary: Negative for hematuria, flank pain or other change in urine volume.  Musculoskeletal: Negative for myalgias or other joint swelling.  Skin: Negative for other color change, or other wound or worsening drainage.  Neurological: Negative for other syncope or numbness. Hematological: Negative for other adenopathy or swelling Psychiatric/Behavioral: Negative for hallucinations, other worsening agitation, SI, self-injury, or new decreased concentration All other system neg per pt    Objective:   Physical Exam BP 116/76   Pulse (!) 45   Temp 98.5 F (36.9  C) (Oral)   Ht 5\' 8"  (1.727 m)   Wt 180 lb (81.6 kg)   SpO2 96%   BMI 27.37 kg/m  VS noted,  Constitutional: Pt is oriented to person, place, and time. Appears well-developed and well-nourished, in no significant distress and comfortable Head: Normocephalic and atraumatic  Eyes: Conjunctivae and EOM are normal. Pupils are equal, round, and reactive to  light Right Ear: External ear normal without discharge Left Ear: External ear normal without discharge Nose: Nose without discharge or deformity Mouth/Throat: Oropharynx is without other ulcerations and moist  Neck: Normal range of motion. Neck supple. No JVD present. No tracheal deviation present or significant neck LA or mass Cardiovascular: Normal rate, regular rhythm, normal heart sounds and intact distal pulses.   Pulmonary/Chest: WOB normal and breath sounds without rales or wheezing  Abdominal: Soft. Bowel sounds are normal. NT. No HSM  Musculoskeletal: Normal range of motion. Exhibits no edema Lymphadenopathy: Has no other cervical adenopathy.  Neurological: Pt is alert and oriented to person, place, and time. Pt has normal reflexes. No cranial nerve deficit. Motor grossly intact, Gait intact Skin: Skin is warm and dry. No rash noted or new ulcerations Psychiatric:  Has normal mood and affect. Behavior is normal without agitation No other exam findings Lab Results  Component Value Date   WBC 8.8 12/10/2017   HGB 14.9 12/10/2017   HCT 43.5 12/10/2017   PLT 201.0 12/10/2017   GLUCOSE 87 12/10/2017   CHOL 146 12/10/2017   TRIG 70.0 12/10/2017   HDL 38.40 (L) 12/10/2017   LDLCALC 94 12/10/2017   ALT 14 12/10/2017   AST 13 12/10/2017   NA 141 12/10/2017   K 4.3 12/10/2017   CL 106 12/10/2017   CREATININE 1.10 12/10/2017   BUN 18 12/10/2017   CO2 29 12/10/2017   TSH 0.89 12/10/2017   PSA 0.66 12/10/2017   MICROALBUR 1.1 02/20/2016       Assessment & Plan:

## 2017-12-16 ENCOUNTER — Encounter: Payer: Self-pay | Admitting: Internal Medicine

## 2017-12-16 MED ORDER — IRBESARTAN 75 MG PO TABS: 75.0000 mg | ORAL_TABLET | Freq: Every day | ORAL | 3 refills | Status: DC

## 2017-12-16 MED ORDER — ATORVASTATIN CALCIUM 20 MG PO TABS: 20.0000 mg | ORAL_TABLET | Freq: Every day | ORAL | 3 refills | Status: DC

## 2017-12-18 NOTE — Assessment & Plan Note (Signed)
stable overall by history and exam, recent data reviewed with pt, and pt to continue medical treatment as before,  to f/u any worsening symptoms or concerns BP Readings from Last 3 Encounters:  12/15/17 116/76  11/26/17 132/82  11/20/17 114/74

## 2017-12-18 NOTE — Assessment & Plan Note (Signed)

## 2018-08-28 ENCOUNTER — Telehealth: Payer: BLUE CROSS/BLUE SHIELD | Admitting: Physician Assistant

## 2018-08-28 DIAGNOSIS — H00012 Hordeolum externum right lower eyelid: Secondary | ICD-10-CM

## 2018-08-28 NOTE — Progress Notes (Signed)
We are sorry that you are not feeling well. Here is how we plan to help!  Based on what you have shared with me it looks like you have a stye.  A stye is an inflammation of the eyelid.  It is often a red, painful lump near the edge of the eyelid that may look like a boil or a pimple.  A stye develops when an infection occurs at the base of an eyelash.   We have made appropriate suggestions for you based upon your presentation: Simple styes can be treated without medical intervention.  Most styes either resolve spontaneously or resolve with simple home treatment by applying warm compresses or heated washcloth to the stye for about 10-15 minutes three to four times a day. This causes the stye to drain and resolve. Styes can take a couple of weeks to heal. If not improving, please schedule a follow-up with your primary care provider or eye doctor.  HOME CARE:   Wash your hands often!  Let the stye open on its own. Don't squeeze or open it.  Don't rub your eyes. This can irritate your eyes and let in bacteria.  If you need to touch your eyes, wash your hands first.  Don't wear eye makeup or contact lenses until the area has healed.  GET HELP RIGHT AWAY IF:   Your symptoms do not improve.  You develop blurred or loss of vision.  Your symptoms worsen (increased discharge, pain or redness).  Thank you for choosing an e-visit.  Your e-visit answers were reviewed by a board certified advanced clinical practitioner to complete your personal care plan.  Depending upon the condition, your plan could have included both over the counter or prescription medications.  Please review your pharmacy choice.  Make sure the pharmacy is open so you can pick up prescription now.  If there is a problem, you may contact your provider through [employer] messaging and have the prescription routed to another pharmacy.    Your safety is important to us.  If you have drug allergies check your prescription  carefully.  For the next 24 hours you can use MyChart to ask questions about today's visit, request a non-urgent call back, or ask for a work or school excuse.  You will get an email in the next two days asking about your experience.  I hope you that your e-visit has been valuable and will speed your recovery.

## 2018-12-14 ENCOUNTER — Other Ambulatory Visit (INDEPENDENT_AMBULATORY_CARE_PROVIDER_SITE_OTHER): Payer: BLUE CROSS/BLUE SHIELD

## 2018-12-14 DIAGNOSIS — Z0000 Encounter for general adult medical examination without abnormal findings: Secondary | ICD-10-CM

## 2018-12-14 DIAGNOSIS — Z125 Encounter for screening for malignant neoplasm of prostate: Secondary | ICD-10-CM | POA: Diagnosis not present

## 2018-12-14 LAB — CBC WITH DIFFERENTIAL/PLATELET
Basophils Absolute: 0.1 10*3/uL (ref 0.0–0.1)
Basophils Relative: 0.7 % (ref 0.0–3.0)
Eosinophils Absolute: 0.2 10*3/uL (ref 0.0–0.7)
Eosinophils Relative: 2.3 % (ref 0.0–5.0)
HEMATOCRIT: 45 % (ref 39.0–52.0)
Hemoglobin: 15.4 g/dL (ref 13.0–17.0)
LYMPHS PCT: 12.3 % (ref 12.0–46.0)
Lymphs Abs: 1 10*3/uL (ref 0.7–4.0)
MCHC: 34.2 g/dL (ref 30.0–36.0)
MCV: 93 fl (ref 78.0–100.0)
Monocytes Absolute: 0.7 10*3/uL (ref 0.1–1.0)
Monocytes Relative: 8.3 % (ref 3.0–12.0)
Neutro Abs: 6.4 10*3/uL (ref 1.4–7.7)
Neutrophils Relative %: 76.4 % (ref 43.0–77.0)
Platelets: 186 10*3/uL (ref 150.0–400.0)
RBC: 4.84 Mil/uL (ref 4.22–5.81)
RDW: 13.5 % (ref 11.5–15.5)
WBC: 8.3 10*3/uL (ref 4.0–10.5)

## 2018-12-14 LAB — HEPATIC FUNCTION PANEL
ALK PHOS: 49 U/L (ref 39–117)
ALT: 23 U/L (ref 0–53)
AST: 18 U/L (ref 0–37)
Albumin: 4.2 g/dL (ref 3.5–5.2)
Bilirubin, Direct: 0.1 mg/dL (ref 0.0–0.3)
Total Bilirubin: 0.6 mg/dL (ref 0.2–1.2)
Total Protein: 6.1 g/dL (ref 6.0–8.3)

## 2018-12-14 LAB — BASIC METABOLIC PANEL
BUN: 23 mg/dL (ref 6–23)
CO2: 26 mEq/L (ref 19–32)
Calcium: 9.1 mg/dL (ref 8.4–10.5)
Chloride: 104 mEq/L (ref 96–112)
Creatinine, Ser: 1.09 mg/dL (ref 0.40–1.50)
GFR: 69.85 mL/min (ref >60.00)
Glucose, Bld: 78 mg/dL (ref 70–99)
Potassium: 4.6 mEq/L (ref 3.5–5.1)
Sodium: 138 mEq/L (ref 135–145)

## 2018-12-14 LAB — URINALYSIS, ROUTINE W REFLEX MICROSCOPIC
BILIRUBIN URINE: NEGATIVE
Hgb urine dipstick: NEGATIVE
KETONES UR: NEGATIVE
LEUKOCYTE UA: NEGATIVE
Nitrite: NEGATIVE
PH: 6 (ref 5.0–8.0)
RBC / HPF: NONE SEEN (ref 0–?)
Specific Gravity, Urine: 1.02 (ref 1.000–1.030)
TOTAL PROTEIN, URINE-UPE24: NEGATIVE
UROBILINOGEN UA: 0.2 (ref 0.0–1.0)
Urine Glucose: NEGATIVE
WBC, UA: NONE SEEN (ref 0–?)

## 2018-12-14 LAB — LIPID PANEL
Cholesterol: 161 mg/dL (ref 0–200)
HDL: 44 mg/dL (ref >39.00)
LDL Cholesterol: 101 mg/dL — ABNORMAL HIGH (ref 0–99)
NonHDL: 117.31
Total CHOL/HDL Ratio: 4
Triglycerides: 80 mg/dL (ref 0.0–149.0)
VLDL: 16 mg/dL (ref 0.0–40.0)

## 2018-12-14 LAB — TSH: TSH: 1.76 u[IU]/mL (ref 0.35–4.50)

## 2018-12-14 LAB — PSA: PSA: 0.61 ng/mL (ref 0.10–4.00)

## 2018-12-19 ENCOUNTER — Encounter: Payer: Self-pay | Admitting: Internal Medicine

## 2018-12-19 ENCOUNTER — Ambulatory Visit (INDEPENDENT_AMBULATORY_CARE_PROVIDER_SITE_OTHER): Payer: BLUE CROSS/BLUE SHIELD | Admitting: Internal Medicine

## 2018-12-19 VITALS — BP 122/80 | HR 56 | Temp 98.0°F | Ht 68.0 in | Wt 181.0 lb

## 2018-12-19 DIAGNOSIS — Z0000 Encounter for general adult medical examination without abnormal findings: Secondary | ICD-10-CM | POA: Diagnosis not present

## 2018-12-19 DIAGNOSIS — I10 Essential (primary) hypertension: Secondary | ICD-10-CM

## 2018-12-19 NOTE — Assessment & Plan Note (Signed)

## 2018-12-19 NOTE — Patient Instructions (Signed)
Please continue all other medications as before, and refills have been done if requested.  Please have the pharmacy call with any other refills you may need.  Please continue your efforts at being more active, low cholesterol diet, and weight control.  You are otherwise up to date with prevention measures today.  Please keep your appointments with your specialists as you may have planned  Please return in 1 year for your yearly visit, or sooner if needed, with Lab testing done 3-5 days before  

## 2018-12-19 NOTE — Assessment & Plan Note (Signed)
stable overall by history and exam, recent data reviewed with pt, and pt to continue medical treatment as before,  to f/u any worsening symptoms or concerns  

## 2018-12-19 NOTE — Progress Notes (Signed)
Subjective:    Patient ID: Jaime Curtis, male    DOB: 1961-09-16, 58 y.o.   MRN: [id number]  HPI  Here for wellness and f/u;  Overall doing ok;  Pt denies Chest pain, worsening SOB, DOE, wheezing, orthopnea, PND, worsening LE edema, palpitations, dizziness or syncope.  Pt denies neurological change such as new headache, facial or extremity weakness.  Pt denies polydipsia, polyuria, or low sugar symptoms. Pt states overall good compliance with treatment and medications, good tolerability, and has been trying to follow appropriate diet.  Pt denies worsening depressive symptoms, suicidal ideation or panic. No fever, night sweats, wt loss, loss of appetite, or other constitutional symptoms.  Pt states good ability with ADL's, has low fall risk, home safety reviewed and adequate, no other significant changes in hearing or vision, and only occasionally active with exercise. No new complaints.  Goes to the gym near daily with swimming 3000 yds most days Past Medical History:  Diagnosis Date  . ALLERGIC RHINITIS 12/07/2007  . Allergy   . Bradycardia, sinus 07/10/2011  . CHEST PAIN 12/07/2007  . DARK URINE 12/07/2007  . Family history of colon cancer 08/11/2013   mother, age 67  . 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  . HEMATOCHEZIA 10/03/2007  . HYPERLIPIDEMIA 12/07/2007  . Hypertension 12/12/2016  . NECK PAIN 11/18/2010  . Pure hypercholesterolemia 08/18/2007  . SINUSITIS- ACUTE-NOS 11/25/2009  . URI 11/18/2010   Past Surgical History:  Procedure Laterality Date  . [employer] bone anchored hearing aide Right   . COLONOSCOPY     6-8 years ago  . hx of fractured right scapula    . inguinal herniorrhapy Bilateral    left  . TONSILLECTOMY      reports that he has never smoked. He has never used smokeless tobacco. He reports current alcohol use. He reports that he does not use drugs. family history includes Cancer in his maternal grandmother; Colon cancer (age of onset:  81) in his mother; Diabetes in his sister; Heart attack in his sister; Sudden death in his father. Allergies  Allergen Reactions  . Pseudoephedrine    Current Outpatient Medications on File Prior to Visit  Medication Sig Dispense Refill  . aspirin 81 MG tablet Take 81 mg by mouth daily.    Marland Kitchen atorvastatin (LIPITOR) 20 MG tablet Take 1 tablet (20 mg total) by mouth daily. 90 tablet 3  . irbesartan (AVAPRO) 75 MG tablet Take 1 tablet (75 mg total) by mouth daily. 90 tablet 3   No current facility-administered medications on file prior to visit.    Review of Systems Constitutional: Negative for other unusual diaphoresis, sweats, appetite or weight changes HENT: Negative for other worsening hearing loss, ear pain, facial swelling, mouth sores or neck stiffness.   Eyes: Negative for other worsening pain, redness or other visual disturbance.  Respiratory: Negative for other stridor or swelling Cardiovascular: Negative for other palpitations or other chest pain  Gastrointestinal: Negative for worsening diarrhea or loose stools, blood in stool, distention or other pain Genitourinary: Negative for hematuria, flank pain or other change in urine volume.  Musculoskeletal: Negative for myalgias or other joint swelling.  Skin: Negative for other color change, or other wound or worsening drainage.  Neurological: Negative for other syncope or numbness. Hematological: Negative for other adenopathy or swelling Psychiatric/Behavioral: Negative for hallucinations, other worsening agitation, SI, self-injury, or new decreased concentration ALl other system neg per pt  Objective:   Physical Exam BP 122/80   Pulse (!) 56   Temp 98 F (36.7 C) (Oral)   Ht 5\' 8"  (1.727 m)   Wt 181 lb (82.1 kg)   SpO2 96%   BMI 27.52 kg/m  VS noted,  Constitutional: Pt is oriented to person, place, and time. Appears well-developed and well-nourished, in no significant distress and comfortable Head: Normocephalic and  atraumatic  Eyes: Conjunctivae and EOM are normal. Pupils are equal, round, and reactive to light Right Ear: External ear normal without discharge Left Ear: External ear normal without discharge Nose: Nose without discharge or deformity Mouth/Throat: Oropharynx is without other ulcerations and moist  Neck: Normal range of motion. Neck supple. No JVD present. No tracheal deviation present or significant neck LA or mass Cardiovascular: Normal rate, regular rhythm, normal heart sounds and intact distal pulses.   Pulmonary/Chest: WOB normal and breath sounds without rales or wheezing  Abdominal: Soft. Bowel sounds are normal. NT. No HSM  Musculoskeletal: Normal range of motion. Exhibits no edema Lymphadenopathy: Has no other cervical adenopathy.  Neurological: Pt is alert and oriented to person, place, and time. Pt has normal reflexes. No cranial nerve deficit. Motor grossly intact, Gait intact Skin: Skin is warm and dry. No rash noted or new ulcerations Psychiatric:  Has normal mood and affect. Behavior is normal without agitation No other exam findings Lab Results  Component Value Date   WBC 8.3 12/14/2018   HGB 15.4 12/14/2018   HCT 45.0 12/14/2018   PLT 186.0 12/14/2018   GLUCOSE 78 12/14/2018   CHOL 161 12/14/2018   TRIG 80.0 12/14/2018   HDL 44.00 12/14/2018   LDLCALC 101 (H) 12/14/2018   ALT 23 12/14/2018   AST 18 12/14/2018   NA 138 12/14/2018   K 4.6 12/14/2018   CL 104 12/14/2018   CREATININE 1.09 12/14/2018   BUN 23 12/14/2018   CO2 26 12/14/2018   TSH 1.76 12/14/2018   PSA 0.61 12/14/2018   MICROALBUR 1.1 02/20/2016       Assessment & Plan:

## 2018-12-28 ENCOUNTER — Other Ambulatory Visit: Payer: Self-pay | Admitting: Internal Medicine

## 2018-12-29 ENCOUNTER — Other Ambulatory Visit: Payer: Self-pay | Admitting: Internal Medicine

## 2019-11-13 IMAGING — DX DG CHEST 2V
2 series · 2 of 2 positions shown · non-contrast
Comparison: 08/11/2013

CLINICAL DATA: Cough and wheezing. Fevers. Recent diagnosis of flu.

EXAM:
CHEST  2 VIEW

[chest pa]
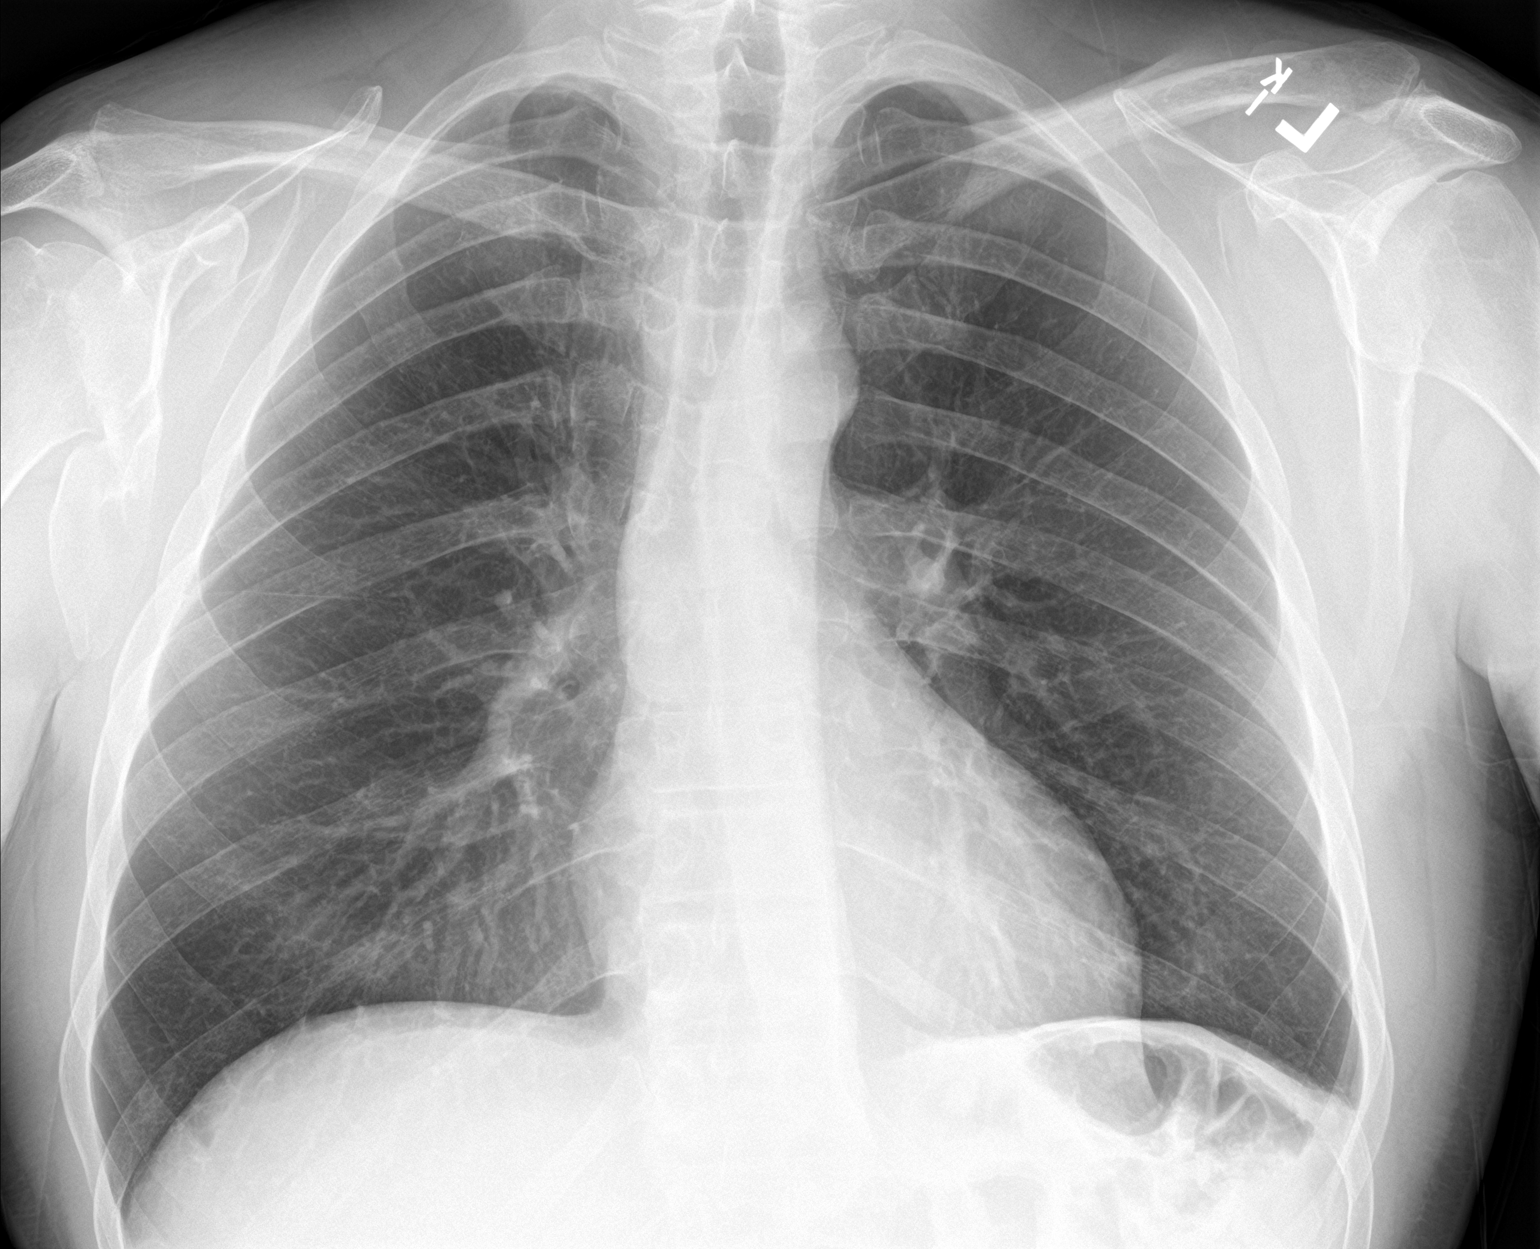

[chest lat]
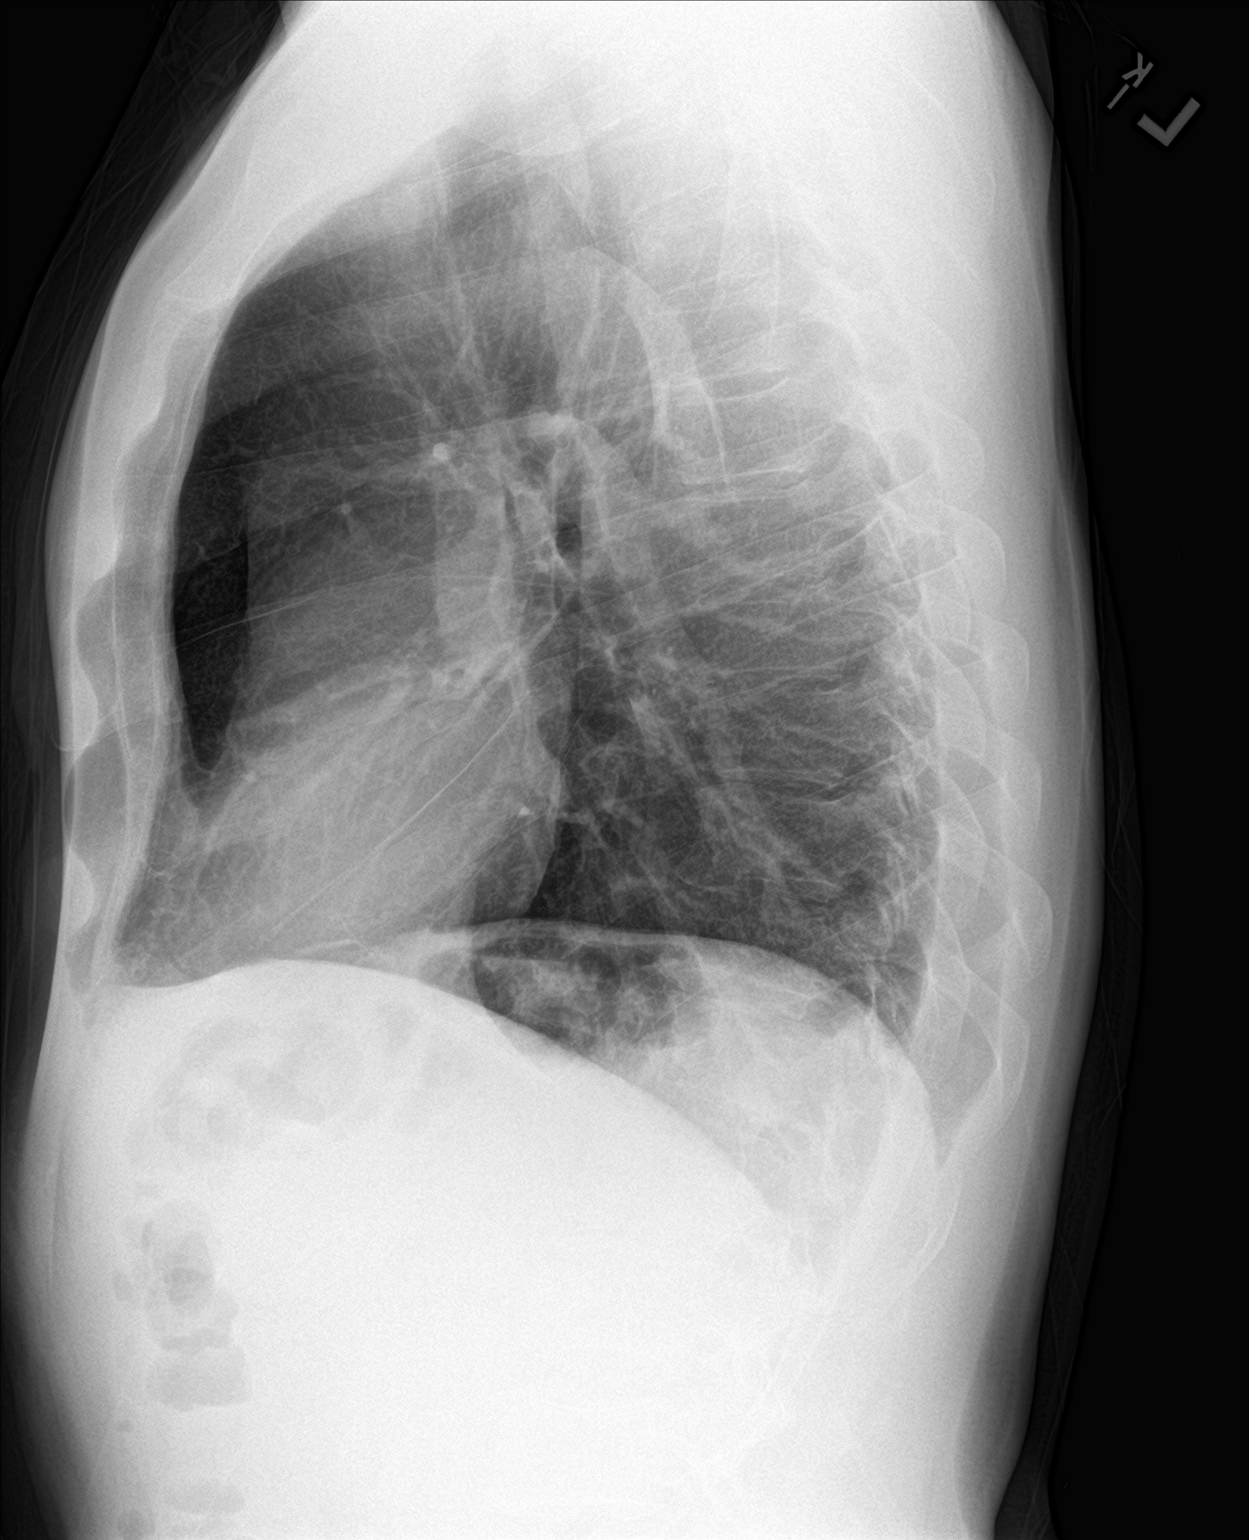

[2 of 2 positions shown; findings below may reference images not displayed]

FINDINGS: The cardiomediastinal contours are normal. The lungs are clear.
Pulmonary vasculature is normal. No consolidation, pleural effusion,
or pneumothorax. No acute osseous abnormalities are seen. Remote
posttraumatic deformity of the right scapula is unchanged.
IMPRESSION: No active cardiopulmonary disease. No explanation for cough and
wheezing.

## 2019-12-20 ENCOUNTER — Encounter: Payer: BLUE CROSS/BLUE SHIELD | Admitting: Internal Medicine

## 2019-12-24 ENCOUNTER — Other Ambulatory Visit: Payer: Self-pay | Admitting: Internal Medicine

## 2019-12-24 NOTE — Telephone Encounter (Signed)
Please refill as per office routine med refill policy (all routine meds refilled for 3 mo or monthly per pt preference up to one year from last visit, then month to month grace period for 3 mo, then further med refills will have to be denied)  

## 2019-12-29 ENCOUNTER — Telehealth: Payer: Self-pay | Admitting: Internal Medicine

## 2019-12-29 NOTE — Telephone Encounter (Signed)
  FYI Patient to get annual labs done at [facility] 4/2

## 2020-01-11 ENCOUNTER — Other Ambulatory Visit (INDEPENDENT_AMBULATORY_CARE_PROVIDER_SITE_OTHER): Payer: BC Managed Care – PPO

## 2020-01-11 DIAGNOSIS — Z0000 Encounter for general adult medical examination without abnormal findings: Secondary | ICD-10-CM

## 2020-01-11 LAB — URINALYSIS, ROUTINE W REFLEX MICROSCOPIC
Hgb urine dipstick: NEGATIVE
Ketones, ur: 15 — AB
Leukocytes,Ua: NEGATIVE
Nitrite: NEGATIVE
RBC / HPF: NONE SEEN (ref 0–?)
Specific Gravity, Urine: 1.02 (ref 1.000–1.030)
Total Protein, Urine: NEGATIVE
Urine Glucose: NEGATIVE
Urobilinogen, UA: 0.2 (ref 0.0–1.0)
pH: 6.5 (ref 5.0–8.0)

## 2020-01-11 LAB — HEPATIC FUNCTION PANEL
ALT: 20 U/L (ref 0–53)
AST: 21 U/L (ref 0–37)
Albumin: 4.3 g/dL (ref 3.5–5.2)
Alkaline Phosphatase: 63 U/L (ref 39–117)
Bilirubin, Direct: 0.2 mg/dL (ref 0.0–0.3)
Total Bilirubin: 1.1 mg/dL (ref 0.2–1.2)
Total Protein: 6.3 g/dL (ref 6.0–8.3)

## 2020-01-11 LAB — CBC WITH DIFFERENTIAL/PLATELET
Basophils Absolute: 0.1 10*3/uL (ref 0.0–0.1)
Basophils Relative: 0.7 % (ref 0.0–3.0)
Eosinophils Absolute: 0.2 10*3/uL (ref 0.0–0.7)
Eosinophils Relative: 1.9 % (ref 0.0–5.0)
HCT: 45.3 % (ref 39.0–52.0)
Hemoglobin: 15.3 g/dL (ref 13.0–17.0)
Lymphocytes Relative: 14.7 % (ref 12.0–46.0)
Lymphs Abs: 1.3 10*3/uL (ref 0.7–4.0)
MCHC: 33.7 g/dL (ref 30.0–36.0)
MCV: 92.9 fl (ref 78.0–100.0)
Monocytes Absolute: 0.7 10*3/uL (ref 0.1–1.0)
Monocytes Relative: 7.2 % (ref 3.0–12.0)
Neutro Abs: 6.9 10*3/uL (ref 1.4–7.7)
Neutrophils Relative %: 75.5 % (ref 43.0–77.0)
Platelets: 210 10*3/uL (ref 150.0–400.0)
RBC: 4.87 Mil/uL (ref 4.22–5.81)
RDW: 13.7 % (ref 11.5–15.5)
WBC: 9.1 10*3/uL (ref 4.0–10.5)

## 2020-01-11 LAB — LIPID PANEL
Cholesterol: 148 mg/dL (ref 0–200)
HDL: 44.6 mg/dL (ref >39.00)
LDL Cholesterol: 86 mg/dL (ref 0–99)
NonHDL: 103.4
Total CHOL/HDL Ratio: 3
Triglycerides: 88 mg/dL (ref 0.0–149.0)
VLDL: 17.6 mg/dL (ref 0.0–40.0)

## 2020-01-11 LAB — BASIC METABOLIC PANEL
BUN: 23 mg/dL (ref 6–23)
CO2: 27 mEq/L (ref 19–32)
Calcium: 9.2 mg/dL (ref 8.4–10.5)
Chloride: 102 mEq/L (ref 96–112)
Creatinine, Ser: 1.16 mg/dL (ref 0.40–1.50)
GFR: 64.76 mL/min (ref >60.00)
Glucose, Bld: 85 mg/dL (ref 70–99)
Potassium: 4.5 mEq/L (ref 3.5–5.1)
Sodium: 137 mEq/L (ref 135–145)

## 2020-01-11 LAB — TSH: TSH: 1.91 u[IU]/mL (ref 0.35–4.50)

## 2020-01-11 LAB — PSA: PSA: 0.54 ng/mL (ref 0.10–4.00)

## 2020-01-12 ENCOUNTER — Other Ambulatory Visit: Payer: Self-pay

## 2020-01-16 ENCOUNTER — Ambulatory Visit (INDEPENDENT_AMBULATORY_CARE_PROVIDER_SITE_OTHER): Payer: BC Managed Care – PPO | Admitting: Internal Medicine

## 2020-01-16 ENCOUNTER — Encounter: Payer: Self-pay | Admitting: Internal Medicine

## 2020-01-16 ENCOUNTER — Other Ambulatory Visit: Payer: Self-pay

## 2020-01-16 VITALS — BP 122/68 | HR 43 | Temp 98.4°F | Wt 177.0 lb

## 2020-01-16 DIAGNOSIS — E611 Iron deficiency: Secondary | ICD-10-CM

## 2020-01-16 DIAGNOSIS — E559 Vitamin D deficiency, unspecified: Secondary | ICD-10-CM

## 2020-01-16 DIAGNOSIS — E538 Deficiency of other specified B group vitamins: Secondary | ICD-10-CM | POA: Diagnosis not present

## 2020-01-16 DIAGNOSIS — Z0000 Encounter for general adult medical examination without abnormal findings: Secondary | ICD-10-CM | POA: Diagnosis not present

## 2020-01-16 DIAGNOSIS — Z23 Encounter for immunization: Secondary | ICD-10-CM | POA: Diagnosis not present

## 2020-01-16 NOTE — Progress Notes (Signed)
Subjective:    Patient ID: Jaime Curtis, male    DOB: 1961/04/25, 59 y.o.   MRN: [id number]  HPI  Here for wellness and f/u;  Overall doing ok;  Pt denies Chest pain, worsening SOB, DOE, wheezing, orthopnea, PND, worsening LE edema, palpitations, dizziness or syncope.  Pt denies neurological change such as new headache, facial or extremity weakness.  Pt denies polydipsia, polyuria, or low sugar symptoms. Pt states overall good compliance with treatment and medications, good tolerability, and has been trying to follow appropriate diet.  Pt denies worsening depressive symptoms, suicidal ideation or panic. No fever, night sweats, wt loss, loss of appetite, or other constitutional symptoms.  Pt states good ability with ADL's, has low fall risk, home safety reviewed and adequate, no other significant changes in hearing or vision, and only occasionally active with exercise.  No new complaints Past Medical History:  Diagnosis Date  . ALLERGIC RHINITIS 12/07/2007  . Allergy   . Bradycardia, sinus 07/10/2011  . CHEST PAIN 12/07/2007  . DARK URINE 12/07/2007  . Family history of colon cancer 08/11/2013   mother, age 72  . 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  . HEMATOCHEZIA 10/03/2007  . HYPERLIPIDEMIA 12/07/2007  . Hypertension 12/12/2016  . NECK PAIN 11/18/2010  . Pure hypercholesterolemia 08/18/2007  . SINUSITIS- ACUTE-NOS 11/25/2009  . URI 11/18/2010   Past Surgical History:  Procedure Laterality Date  . [employer] bone anchored hearing aide Right   . COLONOSCOPY     6-8 years ago  . hx of fractured right scapula    . inguinal herniorrhapy Bilateral    left  . TONSILLECTOMY      reports that he has never smoked. He has never used smokeless tobacco. He reports current alcohol use. He reports that he does not use drugs. family history includes Cancer in his maternal grandmother; Colon cancer (age of onset: 83) in his mother; Diabetes in his sister; Heart attack in  his sister; Sudden death in his father. Allergies  Allergen Reactions  . Pseudoephedrine    Current Outpatient Medications on File Prior to Visit  Medication Sig Dispense Refill  . aspirin 81 MG tablet Take 81 mg by mouth daily.    Marland Kitchen atorvastatin (LIPITOR) 20 MG tablet TAKE 1 TABLET DAILY 90 tablet 0  . irbesartan (AVAPRO) 75 MG tablet Take 1 tablet (75 mg total) by mouth daily. Keep scheduled appt for future refills 90 tablet 0   No current facility-administered medications on file prior to visit.   Review of Systems All otherwise neg per pt     Objective:   Physical Exam BP 122/68 (BP Location: Left Arm)   Pulse (!) 43   Temp 98.4 F (36.9 C) (Oral)   Wt 177 lb (80.3 kg)   SpO2 98%   BMI 26.91 kg/m  VS noted,  Constitutional: Pt appears in NAD HENT: Head: NCAT.  Right Ear: External ear normal.  Left Ear: External ear normal.  Eyes: . Pupils are equal, round, and reactive to light. Conjunctivae and EOM are normal Nose: without d/c or deformity Neck: Neck supple. Gross normal ROM Cardiovascular: Normal rate and regular rhythm.   Pulmonary/Chest: Effort normal and breath sounds without rales or wheezing.  Abd:  Soft, NT, ND, + BS, no organomegaly Neurological: Pt is alert. At baseline orientation, motor grossly intact Skin: Skin is warm. No rashes, other new lesions, no LE edema Psychiatric: Pt behavior is normal without  agitation  Lab Results  Component Value Date   WBC 9.1 01/11/2020   HGB 15.3 01/11/2020   HCT 45.3 01/11/2020   PLT 210.0 01/11/2020   GLUCOSE 85 01/11/2020   CHOL 148 01/11/2020   TRIG 88.0 01/11/2020   HDL 44.60 01/11/2020   LDLCALC 86 01/11/2020   ALT 20 01/11/2020   AST 21 01/11/2020   NA 137 01/11/2020   K 4.5 01/11/2020   CL 102 01/11/2020   CREATININE 1.16 01/11/2020   BUN 23 01/11/2020   CO2 27 01/11/2020   TSH 1.91 01/11/2020   PSA 0.54 01/11/2020   MICROALBUR 1.1 02/20/2016      Assessment & Plan:

## 2020-01-16 NOTE — Patient Instructions (Signed)
You had the Tdap tetanus shot today  Please continue all other medications as before, and refills have been done if requested.  Please have the pharmacy call with any other refills you may need.  Please continue your efforts at being more active, low cholesterol diet, and weight control.  You are otherwise up to date with prevention measures today.  Please keep your appointments with your specialists as you may have planned  Please make an Appointment to return for your 1 year visit, or sooner if needed, with Lab testing by Appointment as well, to be done about 3-5 days before at the FIRST FLOOR Lab (so this is for TWO appointments - please see the scheduling desk as you leave)    

## 2020-01-17 ENCOUNTER — Encounter: Payer: Self-pay | Admitting: Internal Medicine

## 2020-01-17 NOTE — Assessment & Plan Note (Signed)

## 2020-03-23 ENCOUNTER — Other Ambulatory Visit: Payer: Self-pay | Admitting: Internal Medicine

## 2020-03-23 NOTE — Telephone Encounter (Signed)
Please refill as per office routine med refill policy (all routine meds refilled for 3 mo or monthly per pt preference up to one year from last visit, then month to month grace period for 3 mo, then further med refills will have to be denied)  

## 2020-08-15 ENCOUNTER — Telehealth: Payer: Self-pay | Admitting: Internal Medicine

## 2020-08-15 NOTE — Telephone Encounter (Signed)
Patient calling wanting to speak to someone about his hearing device, has a few questions. Patient refused any further detail. Patient # [phone]

## 2020-08-16 NOTE — Telephone Encounter (Signed)
Was able to speak with pt and schedule him to come in to see Dr. Posey Rea as he is experiencing irritation to the scalp in the area where his Bone Anchored Hearing Aid is placed.

## 2020-08-20 ENCOUNTER — Other Ambulatory Visit: Payer: Self-pay

## 2020-08-20 ENCOUNTER — Ambulatory Visit: Payer: BC Managed Care – PPO | Admitting: Internal Medicine

## 2020-08-20 ENCOUNTER — Encounter: Payer: Self-pay | Admitting: Internal Medicine

## 2020-08-20 DIAGNOSIS — G4452 New daily persistent headache (NDPH): Secondary | ICD-10-CM | POA: Diagnosis not present

## 2020-08-20 DIAGNOSIS — R519 Headache, unspecified: Secondary | ICD-10-CM | POA: Insufficient documentation

## 2020-08-20 MED ORDER — MUPIROCIN 2 % EX OINT
TOPICAL_OINTMENT | CUTANEOUS | 0 refills | Status: DC
Start: 2020-08-20 — End: 2020-12-18

## 2020-08-20 MED ORDER — DOXYCYCLINE HYCLATE 100 MG PO TABS: 100.0000 mg | ORAL_TABLET | Freq: Two times a day (BID) | ORAL | 0 refills | Status: DC

## 2020-08-20 NOTE — Progress Notes (Signed)
Subjective:  Patient ID: Jaime Curtis, male    DOB: 03/20/1961  Age: 59 y.o. MRN: [id number]  CC: Follow-up (Irritation on (R) side head) and Headache   HPI Jaime Curtis presents for R HA where HA implant (x4-5 y).  The area was very tender to touch a couple inches around the implant.  Then it has resolved.  There was no fever.  No skin rash or redness  Outpatient Medications Prior to Visit  Medication Sig Dispense Refill  . aspirin 81 MG tablet Take 81 mg by mouth daily.    Marland Kitchen atorvastatin (LIPITOR) 20 MG tablet TAKE 1 TABLET DAILY 90 tablet 3  . irbesartan (AVAPRO) 75 MG tablet TAKE 1 TABLET DAILY (KEEP SCHEDULED APPOINTMENT FOR FUTURE REFILLS) 90 tablet 3   No facility-administered medications prior to visit.    ROS: Review of Systems  Constitutional: Negative for chills, fatigue and fever.  Musculoskeletal: Negative for arthralgias.  Skin: Negative for color change and rash.    Objective:  BP 128/82 (BP Location: Left Arm)   Pulse (!) 50   Temp 97.8 F (36.6 C) (Oral)   Wt 177 lb 3.2 oz (80.4 kg)   SpO2 97%   BMI 26.94 kg/m   BP Readings from Last 3 Encounters:  08/20/20 128/82  01/16/20 122/68  12/19/18 122/80    Wt Readings from Last 3 Encounters:  08/20/20 177 lb 3.2 oz (80.4 kg)  01/16/20 177 lb (80.3 kg)  12/19/18 181 lb (82.1 kg)    Physical Exam Constitutional:      General: He is not in acute distress.    Appearance: He is well-developed.     Comments: NAD  Eyes:     Conjunctiva/sclera: Conjunctivae normal.     Pupils: Pupils are equal, round, and reactive to light.  Neck:     Thyroid: No thyromegaly.     Vascular: No JVD.  Cardiovascular:     Rate and Rhythm: Normal rate and regular rhythm.     Heart sounds: Normal heart sounds. No murmur heard.  No friction rub. No gallop.   Pulmonary:     Effort: Pulmonary effort is normal. No respiratory distress.     Breath sounds: Normal breath sounds. No wheezing or rales.  Chest:     Chest  wall: No tenderness.  Abdominal:     General: Bowel sounds are normal. There is no distension.     Palpations: Abdomen is soft. There is no mass.     Tenderness: There is no abdominal tenderness. There is no guarding or rebound.  Musculoskeletal:        General: No tenderness. Normal range of motion.     Cervical back: Normal range of motion.  Lymphadenopathy:     Cervical: No cervical adenopathy.  Skin:    General: Skin is warm and dry.     Findings: No rash.  Neurological:     Mental Status: He is alert and oriented to person, place, and time.     Cranial Nerves: No cranial nerve deficit.     Motor: No abnormal muscle tone.     Coordination: Coordination normal.     Gait: Gait normal.     Deep Tendon Reflexes: Reflexes are normal and symmetric.  Psychiatric:        Behavior: Behavior normal.        Thought Content: Thought content normal.        Judgment: Judgment normal.   The area around the implant without  erythema.  There is some wetness present.  Palpation is nontender.  No pus  Lab Results  Component Value Date   WBC 9.1 01/11/2020   HGB 15.3 01/11/2020   HCT 45.3 01/11/2020   PLT 210.0 01/11/2020   GLUCOSE 85 01/11/2020   CHOL 148 01/11/2020   TRIG 88.0 01/11/2020   HDL 44.60 01/11/2020   LDLCALC 86 01/11/2020   ALT 20 01/11/2020   AST 21 01/11/2020   NA 137 01/11/2020   K 4.5 01/11/2020   CL 102 01/11/2020   CREATININE 1.16 01/11/2020   BUN 23 01/11/2020   CO2 27 01/11/2020   TSH 1.91 01/11/2020   PSA 0.54 01/11/2020   MICROALBUR 1.1 02/20/2016    DG Chest 2 View  Result Date: 11/28/2017 CLINICAL DATA:  Cough and wheezing. Fevers. Recent diagnosis of flu. EXAM: CHEST  2 VIEW COMPARISON:  08/11/2013 FINDINGS: The cardiomediastinal contours are normal. The lungs are clear. Pulmonary vasculature is normal. No consolidation, pleural effusion, or pneumothorax. No acute osseous abnormalities are seen. Remote posttraumatic deformity of the right scapula is  unchanged. IMPRESSION: No active cardiopulmonary disease. No explanation for cough and wheezing. Electronically Signed   By: Rubye Oaks M.D.   On: 11/28/2017 04:52    Assessment & Plan:    Sonda Primes, MD

## 2020-08-26 ENCOUNTER — Encounter: Payer: Self-pay | Admitting: Internal Medicine

## 2020-08-26 NOTE — Assessment & Plan Note (Addendum)
Possible wound infection/cellulitis.  Prescribed Bactroban cream.  Oral doxycycline.

## 2020-10-06 DIAGNOSIS — Z03818 Encounter for observation for suspected exposure to other biological agents ruled out: Secondary | ICD-10-CM | POA: Diagnosis not present

## 2020-12-18 ENCOUNTER — Other Ambulatory Visit: Payer: Self-pay

## 2020-12-18 ENCOUNTER — Ambulatory Visit: Payer: BC Managed Care – PPO | Admitting: Cardiovascular Disease

## 2020-12-18 ENCOUNTER — Encounter: Payer: Self-pay | Admitting: Cardiovascular Disease

## 2020-12-18 VITALS — BP 106/70 | HR 51 | Ht 68.0 in | Wt 174.4 lb

## 2020-12-18 DIAGNOSIS — R001 Bradycardia, unspecified: Secondary | ICD-10-CM | POA: Diagnosis not present

## 2020-12-18 NOTE — Progress Notes (Signed)
Chief Complaint  Patient presents with  . Follow-up    Sinus bradycardia   History of Present Illness: 60 yo male with history of HTN, HLD and bradycardia here today for cardiac follow up. I saw him as a new patient for evaluation of bradycardia in March 2018. He has had sinus bradycardia for years. He is very active and exercises every day. He had no dizziness, near syncope or syncope. He has no chest pain or dyspnea. He has been told that his heart rate was slow for his entire adult life. No other cardiac issues. His father died of sudden death at age 29 but the cause of this was not clear. Echo March 2018 with LVEF=55-60%, no valve disease. Exercise stress test March 2018 with normal heart rate response, no ischemia.   He is here today for follow up. The patient denies any chest pain, dyspnea, palpitations, lower extremity edema, orthopnea, PND, dizziness, near syncope or syncope.   Primary Care Physician: Corwin Levins, MD   Past Medical History:  Diagnosis Date  . ALLERGIC RHINITIS 12/07/2007  . Allergy   . Bradycardia, sinus 07/10/2011  . CHEST PAIN 12/07/2007  . DARK URINE 12/07/2007  . Family history of colon cancer 08/11/2013   mother, age 70  . 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  . HEMATOCHEZIA 10/03/2007  . HYPERLIPIDEMIA 12/07/2007  . Hypertension 12/12/2016  . NECK PAIN 11/18/2010  . Pure hypercholesterolemia 08/18/2007  . SINUSITIS- ACUTE-NOS 11/25/2009  . URI 11/18/2010    Past Surgical History:  Procedure Laterality Date  . baha bone anchored hearing aide Right   . COLONOSCOPY     6-8 years ago  . hx of fractured right scapula    . inguinal herniorrhapy Bilateral    left  . TONSILLECTOMY      Current Outpatient Medications  Medication Sig Dispense Refill  . aspirin 81 MG tablet Take 81 mg by mouth daily.    Marland Kitchen atorvastatin (LIPITOR) 20 MG tablet TAKE 1 TABLET DAILY 90 tablet 3  . irbesartan (AVAPRO) 75 MG tablet TAKE 1 TABLET  DAILY (KEEP SCHEDULED APPOINTMENT FOR FUTURE REFILLS) 90 tablet 3   No current facility-administered medications for this visit.    Allergies  Allergen Reactions  . Pseudoephedrine     Social History   Socioeconomic History  . Marital status: Married    Spouse name: Not on file  . Number of children: 3  . Years of education: Not on file  . Highest education level: Not on file  Occupational History  . Occupation: Field seismologist  Tobacco Use  . Smoking status: Never Smoker  . Smokeless tobacco: Never Used  Substance and Sexual Activity  . Alcohol use: Yes    Alcohol/week: 0.0 standard drinks    Comment: socially  . Drug use: No  . Sexual activity: Not on file  Other Topics Concern  . Not on file  Social History Narrative  . Not on file   Social Determinants of Health   Financial Resource Strain: Not on file  Food Insecurity: Not on file  Transportation Needs: Not on file  Physical Activity: Not on file  Stress: Not on file  Social Connections: Not on file  Intimate Partner Violence: Not on file    Family History  Problem Relation Age of Onset  . Colon cancer Mother 15       doing well s/p colon surgery  . Sudden death Father   .  Diabetes Sister   . Cancer Maternal Grandmother        type unknown  . Heart attack Sister   . Rectal cancer Neg Hx   . Stomach cancer Neg Hx     Review of Systems:  As stated in the HPI and otherwise negative.   BP 106/70   Pulse (!) 51   Ht 5\' 8"  (1.727 m)   Wt 174 lb 6.4 oz (79.1 kg)   SpO2 99%   BMI 26.52 kg/m   Physical Examination: General: Well developed, well nourished, NAD  HEENT: OP clear, mucus membranes moist  SKIN: warm, dry. No rashes. Neuro: No focal deficits  Musculoskeletal: Muscle strength 5/5 all ext  Psychiatric: Mood and affect normal  Neck: No JVD, no carotid bruits, no thyromegaly, no lymphadenopathy.  Lungs:Clear bilaterally, no wheezes, rhonci, crackles Cardiovascular: Regular rate and rhythm.  No murmurs, gallops or rubs. Abdomen:Soft. Bowel sounds present. Non-tender.  Extremities: No lower extremity edema. Pulses are 2 + in the bilateral DP/PT.  EKG:  EKG is ordered today. The ekg is personally reviewed and shows sinus bradycardia, rate 51 bpm  Recent Labs: 01/11/2020: ALT 20; BUN 23; Creatinine, Ser 1.16; Hemoglobin 15.3; Platelets 210.0; Potassium 4.5; Sodium 137; TSH 1.91   Lipid Panel    Component Value Date/Time   CHOL 148 01/11/2020 0742   TRIG 88.0 01/11/2020 0742   HDL 44.60 01/11/2020 0742   CHOLHDL 3 01/11/2020 0742   VLDL 17.6 01/11/2020 0742   LDLCALC 86 01/11/2020 0742     Wt Readings from Last 3 Encounters:  12/18/20 174 lb 6.4 oz (79.1 kg)  08/20/20 177 lb 3.2 oz (80.4 kg)  01/16/20 177 lb (80.3 kg)     Other studies Reviewed: Additional studies/ records that were reviewed today include: . Review of the above records demonstrates:   Assessment and Plan:   1. Sinus bradycardia: He is very active and remains asymptomatic. Echo normal in 2018. Normal heart rate response with exercise in 2018.     Current medicines are reviewed at length with the patient today.  The patient does not have concerns regarding medicines.  The following changes have been made:  no change  Labs/ tests ordered today include:   Orders Placed This Encounter  Procedures  . EKG 12-Lead    Disposition:   F/U with me in 2 years.   Signed, 2019, MD 12/18/2020 10:03 AM    [facility] [address] [place], [place], [place]  [place] Phone: [phone]; Fax: [phone]

## 2020-12-18 NOTE — Patient Instructions (Signed)
Medication Instructions:  No changes *If you need a refill on your cardiac medications before your next appointment, please call your pharmacy*   Lab Work: No changes If you have labs (blood work) drawn today and your tests are completely normal, you will receive your results only by: Marland Kitchen MyChart Message (if you have MyChart) OR . A paper copy in the mail If you have any lab test that is abnormal or we need to change your treatment, we will call you to review the results.   Testing/Procedures: none   Follow-Up: At [facility], you and your health needs are our priority.  As part of our continuing mission to provide you with exceptional heart care, we have created designated Provider Care Teams.  These Care Teams include your primary Cardiologist (physician) and Advanced Practice Providers (APPs -  Physician Assistants and Nurse Practitioners) who all work together to provide you with the care you need, when you need it.   Your next appointment:   24 months  The format for your next appointment:   In Person  Provider:   You may see Verne Carrow, MD or one of the following Advanced Practice Providers on your designated Care Team:    Ronie Spies, PA-C  Jacolyn Reedy, PA-C    Other Instructions

## 2021-01-09 ENCOUNTER — Other Ambulatory Visit (INDEPENDENT_AMBULATORY_CARE_PROVIDER_SITE_OTHER): Payer: BC Managed Care – PPO

## 2021-01-09 ENCOUNTER — Other Ambulatory Visit: Payer: Self-pay

## 2021-01-09 ENCOUNTER — Other Ambulatory Visit: Payer: BC Managed Care – PPO

## 2021-01-09 DIAGNOSIS — E611 Iron deficiency: Secondary | ICD-10-CM

## 2021-01-09 DIAGNOSIS — Z125 Encounter for screening for malignant neoplasm of prostate: Secondary | ICD-10-CM | POA: Diagnosis not present

## 2021-01-09 DIAGNOSIS — E538 Deficiency of other specified B group vitamins: Secondary | ICD-10-CM | POA: Diagnosis not present

## 2021-01-09 DIAGNOSIS — E559 Vitamin D deficiency, unspecified: Secondary | ICD-10-CM

## 2021-01-09 DIAGNOSIS — Z0000 Encounter for general adult medical examination without abnormal findings: Secondary | ICD-10-CM | POA: Diagnosis not present

## 2021-01-09 LAB — CBC WITH DIFFERENTIAL/PLATELET
Basophils Absolute: 0.1 10*3/uL (ref 0.0–0.1)
Basophils Relative: 0.7 % (ref 0.0–3.0)
Eosinophils Absolute: 0.1 10*3/uL (ref 0.0–0.7)
Eosinophils Relative: 1.4 % (ref 0.0–5.0)
HCT: 44.8 % (ref 39.0–52.0)
Hemoglobin: 15.1 g/dL (ref 13.0–17.0)
Lymphocytes Relative: 10.7 % — ABNORMAL LOW (ref 12.0–46.0)
Lymphs Abs: 1 10*3/uL (ref 0.7–4.0)
MCHC: 33.7 g/dL (ref 30.0–36.0)
MCV: 92 fl (ref 78.0–100.0)
Monocytes Absolute: 0.5 10*3/uL (ref 0.1–1.0)
Monocytes Relative: 5.4 % (ref 3.0–12.0)
Neutro Abs: 7.6 10*3/uL (ref 1.4–7.7)
Neutrophils Relative %: 81.8 % — ABNORMAL HIGH (ref 43.0–77.0)
Platelets: 207 10*3/uL (ref 150.0–400.0)
RBC: 4.87 Mil/uL (ref 4.22–5.81)
RDW: 14 % (ref 11.5–15.5)
WBC: 9.3 10*3/uL (ref 4.0–10.5)

## 2021-01-09 LAB — LIPID PANEL
Cholesterol: 157 mg/dL (ref 0–200)
HDL: 44.2 mg/dL (ref >39.00)
LDL Cholesterol: 89 mg/dL (ref 0–99)
NonHDL: 112.86
Total CHOL/HDL Ratio: 4
Triglycerides: 118 mg/dL (ref 0.0–149.0)
VLDL: 23.6 mg/dL (ref 0.0–40.0)

## 2021-01-09 LAB — URINALYSIS, ROUTINE W REFLEX MICROSCOPIC
Hgb urine dipstick: NEGATIVE
Leukocytes,Ua: NEGATIVE
Nitrite: NEGATIVE
RBC / HPF: NONE SEEN (ref 0–?)
Specific Gravity, Urine: 1.02 (ref 1.000–1.030)
Total Protein, Urine: NEGATIVE
Urine Glucose: NEGATIVE
Urobilinogen, UA: 1 (ref 0.0–1.0)
pH: 6 (ref 5.0–8.0)

## 2021-01-09 LAB — HEPATIC FUNCTION PANEL
ALT: 29 U/L (ref 0–53)
AST: 21 U/L (ref 0–37)
Albumin: 4.3 g/dL (ref 3.5–5.2)
Alkaline Phosphatase: 54 U/L (ref 39–117)
Bilirubin, Direct: 0.1 mg/dL (ref 0.0–0.3)
Total Bilirubin: 0.7 mg/dL (ref 0.2–1.2)
Total Protein: 6.2 g/dL (ref 6.0–8.3)

## 2021-01-09 LAB — BASIC METABOLIC PANEL
BUN: 28 mg/dL — ABNORMAL HIGH (ref 6–23)
CO2: 27 mEq/L (ref 19–32)
Calcium: 9 mg/dL (ref 8.4–10.5)
Chloride: 107 mEq/L (ref 96–112)
Creatinine, Ser: 1.08 mg/dL (ref 0.40–1.50)
GFR: 75.6 mL/min (ref >60.00)
Glucose, Bld: 93 mg/dL (ref 70–99)
Potassium: 4.8 mEq/L (ref 3.5–5.1)
Sodium: 141 mEq/L (ref 135–145)

## 2021-01-09 LAB — PSA: PSA: 0.57 ng/mL (ref 0.10–4.00)

## 2021-01-09 LAB — VITAMIN D 25 HYDROXY (VIT D DEFICIENCY, FRACTURES): VITD: 21.32 ng/mL — ABNORMAL LOW (ref 30.00–100.00)

## 2021-01-09 LAB — VITAMIN B12: Vitamin B-12: 171 pg/mL — ABNORMAL LOW (ref 211–911)

## 2021-01-09 LAB — IBC PANEL
Iron: 81 ug/dL (ref 42–165)
Saturation Ratios: 26.7 % (ref 20.0–50.0)
Transferrin: 217 mg/dL (ref 212.0–360.0)

## 2021-01-09 LAB — TSH: TSH: 1.47 u[IU]/mL (ref 0.35–4.50)

## 2021-01-16 ENCOUNTER — Ambulatory Visit (INDEPENDENT_AMBULATORY_CARE_PROVIDER_SITE_OTHER): Payer: BC Managed Care – PPO | Admitting: Internal Medicine

## 2021-01-16 ENCOUNTER — Other Ambulatory Visit: Payer: Self-pay

## 2021-01-16 ENCOUNTER — Encounter: Payer: Self-pay | Admitting: Internal Medicine

## 2021-01-16 VITALS — BP 132/76 | HR 42 | Ht 68.0 in | Wt 178.0 lb

## 2021-01-16 DIAGNOSIS — E559 Vitamin D deficiency, unspecified: Secondary | ICD-10-CM | POA: Diagnosis not present

## 2021-01-16 DIAGNOSIS — E7800 Pure hypercholesterolemia, unspecified: Secondary | ICD-10-CM | POA: Diagnosis not present

## 2021-01-16 DIAGNOSIS — E538 Deficiency of other specified B group vitamins: Secondary | ICD-10-CM | POA: Diagnosis not present

## 2021-01-16 DIAGNOSIS — I10 Essential (primary) hypertension: Secondary | ICD-10-CM | POA: Diagnosis not present

## 2021-01-16 DIAGNOSIS — Z0001 Encounter for general adult medical examination with abnormal findings: Secondary | ICD-10-CM

## 2021-01-16 MED ORDER — ATORVASTATIN CALCIUM 20 MG PO TABS: 1.0000 | ORAL_TABLET | Freq: Every day | ORAL | 3 refills | Status: DC

## 2021-01-16 MED ORDER — IRBESARTAN 75 MG PO TABS
ORAL_TABLET | ORAL | 3 refills | Status: DC
Start: 2021-01-16 — End: 2022-01-28

## 2021-01-16 NOTE — Patient Instructions (Signed)
Dicyclomine is the name of the medication that is sometimes tried for IBS  Please take OTC Vitamin D3 at 2000 units per day, indefinitely, as well as B12 1000 mcg for at least one year  Please continue all other medications as before, and refills have been done if requested.  Please have the pharmacy call with any other refills you may need.  Please continue your efforts at being more active, low cholesterol diet, and weight control.  You are otherwise up to date with prevention measures today.  Please keep your appointments with your specialists as you may have planned  Please make an Appointment to return for your 1 year visit, or sooner if needed, with Lab testing by Appointment as well, to be done about 3-5 days before at the FIRST FLOOR Lab (so this is for TWO appointments - please see the scheduling desk as you leave)  Due to the ongoing Covid 19 pandemic, our lab now requires an appointment for any labs done at our office.  If you need labs done and do not have an appointment, please call our office ahead of time to schedule before presenting to the lab for your testing.

## 2021-01-16 NOTE — Assessment & Plan Note (Signed)
Lab Results  Component Value Date   LDLCALC 89 01/09/2021   Stable, pt to continue current statin lipitor

## 2021-01-16 NOTE — Assessment & Plan Note (Signed)
Lab Results  Component Value Date   VITAMINB12 171 (L) 01/09/2021   Low, to start oral replacement - b12 1000 mcg qd

## 2021-01-16 NOTE — Assessment & Plan Note (Signed)
BP Readings from Last 3 Encounters:  01/16/21 132/76  12/18/20 106/70  08/20/20 128/82   Stable, pt to continue medical treatment avapro

## 2021-01-16 NOTE — Assessment & Plan Note (Signed)
Last vitamin D Lab Results  Component Value Date   VD25OH 21.32 (L) 01/09/2021   Low to start oral replacement

## 2021-01-16 NOTE — Progress Notes (Signed)
Patient ID: [name], male   DOB: [birth year], 60 y.o.   MRN: [id number]         Chief Complaint:: wellness exam and low vit d and low vit b12, htn, hld       HPI:  Jaime Curtis is a 60 y.o. male here for wellness exam; already up to date with preventive referrals and immunizations                        Also not taking B12 or Vit D.  Pt denies chest pain, increased sob or doe, wheezing, orthopnea, PND, increased LE swelling, palpitations, dizziness or syncope.   Pt denies polydipsia, polyuria,  Denies new worsening focal neuro s/s.   Pt denies fever, wt loss, night sweats, loss of appetite, or other constitutional symptoms  No other new complaints  Wt Readings from Last 3 Encounters:  01/16/21 178 lb (80.7 kg)  12/18/20 174 lb 6.4 oz (79.1 kg)  08/20/20 177 lb 3.2 oz (80.4 kg)   BP Readings from Last 3 Encounters:  01/16/21 132/76  12/18/20 106/70  08/20/20 128/82   Immunization History  Administered Date(s) Administered  . Influenza Inj Mdck Quad Pf 08/06/2020  . Influenza Split 07/10/2011, 08/01/2012  . Influenza Whole 07/07/2010  . Influenza,inj,Quad PF,6+ Mos 08/12/2014, 07/30/2019  . Influenza-Unspecified 07/26/2016, 06/18/2017, 07/26/2018  . PFIZER(Purple Top)SARS-COV-2 Vaccination 11/16/2019, 12/07/2019, 08/10/2020  . Td 07/01/2009  . Tdap 01/16/2020  There are no preventive care reminders to display for this patient.    Past Medical History:  Diagnosis Date  . ALLERGIC RHINITIS 12/07/2007  . Allergy   . Bradycardia, sinus 07/10/2011  . CHEST PAIN 12/07/2007  . DARK URINE 12/07/2007  . Family history of colon cancer 08/11/2013   mother, age 3  . 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  . HEMATOCHEZIA 10/03/2007  . HYPERLIPIDEMIA 12/07/2007  . Hypertension 12/12/2016  . NECK PAIN 11/18/2010  . Pure hypercholesterolemia 08/18/2007  . SINUSITIS- ACUTE-NOS 11/25/2009  . URI 11/18/2010   Past Surgical History:  Procedure Laterality  Date  . baha bone anchored hearing aide Right   . COLONOSCOPY     6-8 years ago  . hx of fractured right scapula    . inguinal herniorrhapy Bilateral    left  . TONSILLECTOMY      reports that he has never smoked. He has never used smokeless tobacco. He reports current alcohol use. He reports that he does not use drugs. family history includes Cancer in his maternal grandmother; Colon cancer (age of onset: 31) in his mother; Diabetes in his sister; Heart attack in his sister; Sudden death in his father. Allergies  Allergen Reactions  . Pseudoephedrine    Current Outpatient Medications on File Prior to Visit  Medication Sig Dispense Refill  . aspirin 81 MG tablet Take 81 mg by mouth daily.     No current facility-administered medications on file prior to visit.        ROS:  All others reviewed and negative.  Objective        PE:  BP 132/76 (BP Location: Left Arm, Patient Position: Sitting, Cuff Size: Large)   Pulse (!) 42   Ht 5\' 8"  (1.727 m)   Wt 178 lb (80.7 kg)   SpO2 98%   BMI 27.06 kg/m                 Constitutional: Pt  appears in NAD               HENT: Head: NCAT.                Right Ear: External ear normal.                 Left Ear: External ear normal.                Eyes: . Pupils are equal, round, and reactive to light. Conjunctivae and EOM are normal               Nose: without d/c or deformity               Neck: Neck supple. Gross normal ROM               Cardiovascular: Normal rate and regular rhythm.                 Pulmonary/Chest: Effort normal and breath sounds without rales or wheezing.                Abd:  Soft, NT, ND, + BS, no organomegaly               Neurological: Pt is alert. At baseline orientation, motor grossly intact               Skin: Skin is warm. No rashes, no other new lesions, LE edema - none               Psychiatric: Pt behavior is normal without agitation   Micro: none  Cardiac tracings I have personally interpreted today:   none  Pertinent Radiological findings (summarize): none   Lab Results  Component Value Date   WBC 9.3 01/09/2021   HGB 15.1 01/09/2021   HCT 44.8 01/09/2021   PLT 207.0 01/09/2021   GLUCOSE 93 01/09/2021   CHOL 157 01/09/2021   TRIG 118.0 01/09/2021   HDL 44.20 01/09/2021   LDLCALC 89 01/09/2021   ALT 29 01/09/2021   AST 21 01/09/2021   NA 141 01/09/2021   K 4.8 01/09/2021   CL 107 01/09/2021   CREATININE 1.08 01/09/2021   BUN 28 (H) 01/09/2021   CO2 27 01/09/2021   TSH 1.47 01/09/2021   PSA 0.57 01/09/2021   MICROALBUR 1.1 02/20/2016   Assessment/Plan:  Jaime Curtis is a 60 y.o. White or Caucasian [1] male with  has a past medical history of ALLERGIC RHINITIS (12/07/2007), Allergy, Bradycardia, sinus (07/10/2011), CHEST PAIN (12/07/2007), DARK URINE (12/07/2007), Family history of colon cancer (08/11/2013), Hearing loss in right ear, HEMATOCHEZIA (10/03/2007), HYPERLIPIDEMIA (12/07/2007), Hypertension (12/12/2016), NECK PAIN (11/18/2010), Pure hypercholesterolemia (08/18/2007), SINUSITIS- ACUTE-NOS (11/25/2009), and URI (11/18/2010).  Encounter for well adult exam with abnormal findings Age and sex appropriate education and counseling updated with regular exercise and diet Referrals for preventative services - none needed Immunizations addressed - none needed Smoking counseling  - none needed Evidence for depression or other mood disorder - none significant Most recent labs reviewed. I have personally reviewed and have noted: 1) the patient's medical and social history 2) The patient's current medications and supplements 3) The patient's height, weight, and BMI have been recorded in the chart   Vitamin D deficiency Last vitamin D Lab Results  Component Value Date   VD25OH 21.32 (L) 01/09/2021   Low to start oral replacement  Pure hypercholesterolemia Lab Results  Component Value Date   LDLCALC 89 01/09/2021  Stable, pt to continue current statin  lipitor   Hypertension BP Readings from Last 3 Encounters:  01/16/21 132/76  12/18/20 106/70  08/20/20 128/82   Stable, pt to continue medical treatment avapro   B12 deficiency Lab Results  Component Value Date   VITAMINB12 171 (L) 01/09/2021   Low, to start oral replacement - b12 1000 mcg qd  Followup: Return in about 1 year (around 01/16/2022).  Oliver Barre, MD 01/16/2021 6:58 PM [facility]

## 2021-01-16 NOTE — Assessment & Plan Note (Signed)

## 2021-01-16 NOTE — Addendum Note (Signed)
Addended by: Corwin Levins on: 01/16/2021 07:00 PM   Modules accepted: Orders

## 2021-08-20 DIAGNOSIS — L821 Other seborrheic keratosis: Secondary | ICD-10-CM | POA: Diagnosis not present

## 2021-08-20 DIAGNOSIS — L578 Other skin changes due to chronic exposure to nonionizing radiation: Secondary | ICD-10-CM | POA: Diagnosis not present

## 2021-08-20 DIAGNOSIS — L570 Actinic keratosis: Secondary | ICD-10-CM | POA: Diagnosis not present

## 2021-08-20 DIAGNOSIS — L814 Other melanin hyperpigmentation: Secondary | ICD-10-CM | POA: Diagnosis not present

## 2021-08-20 DIAGNOSIS — D1801 Hemangioma of skin and subcutaneous tissue: Secondary | ICD-10-CM | POA: Diagnosis not present

## 2021-11-19 DIAGNOSIS — H1131 Conjunctival hemorrhage, right eye: Secondary | ICD-10-CM | POA: Diagnosis not present

## 2021-11-19 DIAGNOSIS — H16223 Keratoconjunctivitis sicca, not specified as Sjogren's, bilateral: Secondary | ICD-10-CM | POA: Diagnosis not present

## 2021-11-25 DIAGNOSIS — H40033 Anatomical narrow angle, bilateral: Secondary | ICD-10-CM | POA: Diagnosis not present

## 2021-11-25 DIAGNOSIS — H25813 Combined forms of age-related cataract, bilateral: Secondary | ICD-10-CM | POA: Diagnosis not present

## 2021-11-25 DIAGNOSIS — H1131 Conjunctival hemorrhage, right eye: Secondary | ICD-10-CM | POA: Diagnosis not present

## 2022-01-15 ENCOUNTER — Other Ambulatory Visit (INDEPENDENT_AMBULATORY_CARE_PROVIDER_SITE_OTHER): Payer: BC Managed Care – PPO

## 2022-01-15 DIAGNOSIS — E538 Deficiency of other specified B group vitamins: Secondary | ICD-10-CM | POA: Diagnosis not present

## 2022-01-15 DIAGNOSIS — E559 Vitamin D deficiency, unspecified: Secondary | ICD-10-CM | POA: Diagnosis not present

## 2022-01-15 DIAGNOSIS — Z0001 Encounter for general adult medical examination with abnormal findings: Secondary | ICD-10-CM

## 2022-01-15 DIAGNOSIS — E7800 Pure hypercholesterolemia, unspecified: Secondary | ICD-10-CM

## 2022-01-15 DIAGNOSIS — Z125 Encounter for screening for malignant neoplasm of prostate: Secondary | ICD-10-CM | POA: Diagnosis not present

## 2022-01-15 LAB — URINALYSIS, ROUTINE W REFLEX MICROSCOPIC
Bilirubin Urine: NEGATIVE
Hgb urine dipstick: NEGATIVE
Leukocytes,Ua: NEGATIVE
Nitrite: NEGATIVE
Specific Gravity, Urine: 1.015 (ref 1.000–1.030)
Total Protein, Urine: NEGATIVE
Urine Glucose: NEGATIVE
Urobilinogen, UA: 0.2 (ref 0.0–1.0)
pH: 7 (ref 5.0–8.0)

## 2022-01-15 LAB — CBC WITH DIFFERENTIAL/PLATELET
Basophils Absolute: 0.1 10*3/uL (ref 0.0–0.1)
Basophils Relative: 0.9 % (ref 0.0–3.0)
Eosinophils Absolute: 0.1 10*3/uL (ref 0.0–0.7)
Eosinophils Relative: 1.9 % (ref 0.0–5.0)
HCT: 44.8 % (ref 39.0–52.0)
Hemoglobin: 15.1 g/dL (ref 13.0–17.0)
Lymphocytes Relative: 15.6 % (ref 12.0–46.0)
Lymphs Abs: 1.1 10*3/uL (ref 0.7–4.0)
MCHC: 33.7 g/dL (ref 30.0–36.0)
MCV: 93.1 fl (ref 78.0–100.0)
Monocytes Absolute: 0.4 10*3/uL (ref 0.1–1.0)
Monocytes Relative: 6.3 % (ref 3.0–12.0)
Neutro Abs: 5.3 10*3/uL (ref 1.4–7.7)
Neutrophils Relative %: 75.3 % (ref 43.0–77.0)
Platelets: 204 10*3/uL (ref 150.0–400.0)
RBC: 4.82 Mil/uL (ref 4.22–5.81)
RDW: 13.4 % (ref 11.5–15.5)
WBC: 7.1 10*3/uL (ref 4.0–10.5)

## 2022-01-15 LAB — LIPID PANEL
Cholesterol: 152 mg/dL (ref 0–200)
HDL: 47 mg/dL (ref >39.00)
LDL Cholesterol: 91 mg/dL (ref 0–99)
NonHDL: 105.4
Total CHOL/HDL Ratio: 3
Triglycerides: 72 mg/dL (ref 0.0–149.0)
VLDL: 14.4 mg/dL (ref 0.0–40.0)

## 2022-01-15 LAB — BASIC METABOLIC PANEL
BUN: 21 mg/dL (ref 6–23)
CO2: 29 mEq/L (ref 19–32)
Calcium: 9.4 mg/dL (ref 8.4–10.5)
Chloride: 103 mEq/L (ref 96–112)
Creatinine, Ser: 1.07 mg/dL (ref 0.40–1.50)
GFR: 75.91 mL/min (ref >60.00)
Glucose, Bld: 98 mg/dL (ref 70–99)
Potassium: 4.7 mEq/L (ref 3.5–5.1)
Sodium: 138 mEq/L (ref 135–145)

## 2022-01-15 LAB — VITAMIN D 25 HYDROXY (VIT D DEFICIENCY, FRACTURES): VITD: 25.83 ng/mL — ABNORMAL LOW (ref 30.00–100.00)

## 2022-01-15 LAB — HEPATIC FUNCTION PANEL
ALT: 29 U/L (ref 0–53)
AST: 23 U/L (ref 0–37)
Albumin: 4.3 g/dL (ref 3.5–5.2)
Alkaline Phosphatase: 54 U/L (ref 39–117)
Bilirubin, Direct: 0.1 mg/dL (ref 0.0–0.3)
Total Bilirubin: 0.8 mg/dL (ref 0.2–1.2)
Total Protein: 6 g/dL (ref 6.0–8.3)

## 2022-01-15 LAB — VITAMIN B12: Vitamin B-12: 766 pg/mL (ref 211–911)

## 2022-01-15 LAB — PSA: PSA: 0.55 ng/mL (ref 0.10–4.00)

## 2022-01-15 LAB — TSH: TSH: 1.56 u[IU]/mL (ref 0.35–5.50)

## 2022-01-19 ENCOUNTER — Encounter: Payer: BC Managed Care – PPO | Admitting: Internal Medicine

## 2022-01-22 ENCOUNTER — Encounter: Payer: BC Managed Care – PPO | Admitting: Internal Medicine

## 2022-01-27 ENCOUNTER — Encounter: Payer: Self-pay | Admitting: Internal Medicine

## 2022-01-27 ENCOUNTER — Ambulatory Visit (INDEPENDENT_AMBULATORY_CARE_PROVIDER_SITE_OTHER): Payer: BC Managed Care – PPO | Admitting: Internal Medicine

## 2022-01-27 VITALS — BP 112/60 | HR 42 | Temp 97.7°F | Ht 68.0 in | Wt 179.0 lb

## 2022-01-27 DIAGNOSIS — I10 Essential (primary) hypertension: Secondary | ICD-10-CM | POA: Diagnosis not present

## 2022-01-27 DIAGNOSIS — Z0001 Encounter for general adult medical examination with abnormal findings: Secondary | ICD-10-CM

## 2022-01-27 DIAGNOSIS — E7800 Pure hypercholesterolemia, unspecified: Secondary | ICD-10-CM

## 2022-01-27 DIAGNOSIS — R9431 Abnormal electrocardiogram [ECG] [EKG]: Secondary | ICD-10-CM | POA: Diagnosis not present

## 2022-01-27 DIAGNOSIS — E559 Vitamin D deficiency, unspecified: Secondary | ICD-10-CM | POA: Diagnosis not present

## 2022-01-27 DIAGNOSIS — E538 Deficiency of other specified B group vitamins: Secondary | ICD-10-CM

## 2022-01-27 DIAGNOSIS — M19049 Primary osteoarthritis, unspecified hand: Secondary | ICD-10-CM

## 2022-01-27 NOTE — Progress Notes (Signed)
Patient ID: Jaime Curtis, male   DOB: [birth year], 61 y.o.   MRN: [id number] ? ? ? ?     Chief Complaint:: wellness exam and finger joint pain, low vit d, b12 low, hld ? ?     HPI:  Jaime Curtis is a 61 y.o. male here for wellness exam; declines shingrix, o/w up to date ?         ?              Also Pt denies chest pain, increased sob or doe, wheezing, orthopnea, PND, increased LE swelling, palpitations, dizziness or syncope.   Pt denies polydipsia, polyuria, or new focal neuro s/s.    Pt denies fever, wt loss, night sweats, loss of appetite, or other constitutional symptoms  Also has worsening pain to bilateral first mcp athritic pain daily, worse to use the hand, better to rest.  Taking Vit d 1000 and B21 supplement Trying to follow lower chol diet.  Willing for card CT score.   ?Wt Readings from Last 3 Encounters:  ?01/27/22 179 lb (81.2 kg)  ?01/16/21 178 lb (80.7 kg)  ?12/18/20 174 lb 6.4 oz (79.1 kg)  ? ?BP Readings from Last 3 Encounters:  ?01/27/22 112/60  ?01/16/21 132/76  ?12/18/20 106/70  ? ?Immunization History  ?Administered Date(s) Administered  ? Influenza Inj Mdck Quad Pf 08/06/2020  ? Influenza Split 07/10/2011, 08/01/2012  ? Influenza Whole 07/07/2010  ? Influenza,inj,Quad PF,6+ Mos 08/12/2014, 07/30/2019  ? Influenza-Unspecified 07/26/2016, 06/18/2017, 07/26/2018  ? PFIZER(Purple Top)SARS-COV-2 Vaccination 11/16/2019, 12/07/2019, 08/10/2020, 05/16/2021, 07/17/2021  ? Td 07/01/2009  ? Tdap 01/16/2020  ? ?There are no preventive care reminders to display for this patient. ? ?  ? ?Past Medical History:  ?Diagnosis Date  ? ALLERGIC RHINITIS 12/07/2007  ? Allergy   ? Bradycardia, sinus 07/10/2011  ? CHEST PAIN 12/07/2007  ? DARK URINE 12/07/2007  ? Family history of colon cancer 08/11/2013  ? mother, age 85  ? Hearing loss in right ear   ? 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 HEMATOCHEZIA 10/03/2007  ? HYPERLIPIDEMIA 12/07/2007  ? Hypertension 12/12/2016  ? NECK PAIN 11/18/2010  ? Pure  hypercholesterolemia 08/18/2007  ? SINUSITIS- ACUTE-NOS 11/25/2009  ? URI 11/18/2010  ? ?Past Surgical History:  ?Procedure Laterality Date  ? baha bone anchored hearing aide Right   ? COLONOSCOPY    ? 6-8 years ago  ? hx of fractured right scapula    ? inguinal herniorrhapy Bilateral   ? left  ? TONSILLECTOMY    ? ? reports that he has never smoked. He has never used smokeless tobacco. He reports current alcohol use. He reports that he does not use drugs. ?family history includes Cancer in his maternal grandmother; Colon cancer (age of onset: 19) in his mother; Diabetes in his sister; Heart attack in his sister; Sudden death in his father. ?Allergies  ?Allergen Reactions  ? Pseudoephedrine   ? ?Current Outpatient Medications on File Prior to Visit  ?Medication Sig Dispense Refill  ? aspirin 81 MG tablet Take 81 mg by mouth daily.    ? ?No current facility-administered medications on file prior to visit.  ? ?     ROS:  All others reviewed and negative. ? ?Objective  ? ?     PE:  BP 112/60 (BP Location: Right Arm, Patient Position: Sitting, Cuff Size: Large)   Pulse (!) 42   Temp 97.7 ?F (36.5 ?C) (Oral)   Ht 5\' 8"  (1.727  m)   Wt 179 lb (81.2 kg)   SpO2 98%   BMI 27.22 kg/m?  ? ?              Constitutional: Pt appears in NAD ?              HENT: Head: NCAT.  ?              Right Ear: External ear normal.   ?              Left Ear: External ear normal.  ?              Eyes: . Pupils are equal, round, and reactive to light. Conjunctivae and EOM are normal ?              Nose: without d/c or deformity ?              Neck: Neck supple. Gross normal ROM ?              Cardiovascular: Normal rate and regular rhythm.   ?              Pulmonary/Chest: Effort normal and breath sounds without rales or wheezing.  ?              Abd:  Soft, NT, ND, + BS, no organomegaly ?              Neurological: Pt is alert. At baseline orientation, motor grossly intact ?              Bilateral hand s with first mcps with marked  degnerative changes, no effusion, mild tender ?              Skin: Skin is warm. No rashes, no other new lesions, LE edema - none ?              Psychiatric: Pt behavior is normal without agitation  ? ?Micro: none ? ?Cardiac tracings I have personally interpreted today:  none ? ?Pertinent Radiological findings (summarize): none  ? ?Lab Results  ?Component Value Date  ? WBC 7.1 01/15/2022  ? HGB 15.1 01/15/2022  ? HCT 44.8 01/15/2022  ? PLT 204.0 01/15/2022  ? GLUCOSE 98 01/15/2022  ? CHOL 152 01/15/2022  ? TRIG 72.0 01/15/2022  ? HDL 47.00 01/15/2022  ? [place] 91 01/15/2022  ? ALT 29 01/15/2022  ? AST 23 01/15/2022  ? NA 138 01/15/2022  ? K 4.7 01/15/2022  ? CL 103 01/15/2022  ? CREATININE 1.07 01/15/2022  ? BUN 21 01/15/2022  ? CO2 29 01/15/2022  ? TSH 1.56 01/15/2022  ? PSA 0.55 01/15/2022  ? MICROALBUR 1.1 02/20/2016  ? ?Assessment/Plan:  ?Jaime Curtis is a 61 y.o. White or Caucasian [1] male with  has a past medical history of ALLERGIC RHINITIS (12/07/2007), Allergy, Bradycardia, sinus (07/10/2011), CHEST PAIN (12/07/2007), DARK URINE (12/07/2007), Family history of colon cancer (08/11/2013), Hearing loss in right ear, HEMATOCHEZIA (10/03/2007), HYPERLIPIDEMIA (12/07/2007), Hypertension (12/12/2016), NECK PAIN (11/18/2010), Pure hypercholesterolemia (08/18/2007), SINUSITIS- ACUTE-NOS (11/25/2009), and URI (11/18/2010). ? ?Encounter for well adult exam with abnormal findings ?Age and sex appropriate education and counseling updated with regular exercise and diet ?Referrals for preventative services - none needed ?Immunizations addressed - decines shingrix ?Smoking counseling  - none needed ?Evidence for depression or other mood disorder - none significant ?Most recent labs reviewed. ?I have personally reviewed and have noted: ?1) the patient's medical and social history ?2)  The patient's current medications and supplements ?3) The patient's height, weight, and BMI have been recorded in the chart ? ? ?Vitamin D  deficiency ?Last vitamin D ?Lab Results  ?Component Value Date  ? VD25OH 25.83 (L) 01/15/2022  ? ?Low, for increased oral replacement to 2000 u qd ? ?Pure hypercholesterolemia ?Lab Results  ?Component Value Date  ? [place] 91 01/15/2022  ? ?Stable, pt to continue current statin lipitor 20, also for card Ct score ? ? ?Hypertension ?BP Readings from Last 3 Encounters:  ?01/27/22 112/60  ?01/16/21 132/76  ?12/18/20 106/70  ? ?Stable, pt to continue medical treatment avapro ? ? ?B12 deficiency ?Lab Results  ?Component Value Date  ? [id number] 01/15/2022  ? ?Stable, cont oral replacement - b12 1000 mcg qd ? ? ?Hand arthritis ?Mild uncontrolled, for volt gel topical prn ? ?Followup: Return in about 1 year (around 01/28/2023). ? ?Cathlean Cower, MD 01/31/2022 3:48 PM ?[place] ?[place] ?Internal Medicine ?

## 2022-01-27 NOTE — Patient Instructions (Addendum)
Please consider the shingles shot at [facility] if covered  your insurance ? ?Please take OTC Vitamin D3 at 2000 units per day, indefinitely ? ?Ok to try the Voltaren gel as needed for the hand joint pain ? ?We have discussed the Cardiac CT Score test to measure the calcification level (if any) in your heart arteries.  This test has been ordered in our [facility], so please call [facility] directly, as they prefer this, at [phone] to be scheduled. ? ?Please continue all other medications as before, and refills have been done if requested. ? ?Please have the pharmacy call with any other refills you may need. ? ?Please continue your efforts at being more active, low cholesterol diet, and weight control. ? ?You are otherwise up to date with prevention measures today. ? ?Please keep your appointments with your specialists as you may have planned ? ?Please make an Appointment to return for your 1 year visit, or sooner if needed, with Lab testing by Appointment as well, to be done about 3-5 days before at the [facility] (so this is for TWO appointments - please see the scheduling desk as you leave) ? ? ?Due to the ongoing Covid 19 pandemic, our lab now requires an appointment for any labs done at our office.  If you need labs done and do not have an appointment, please call our office ahead of time to schedule before presenting to the lab for your testing. ? ? ? ? ? ?

## 2022-01-28 ENCOUNTER — Other Ambulatory Visit: Payer: Self-pay | Admitting: Internal Medicine

## 2022-01-28 NOTE — Telephone Encounter (Signed)
Please refill as per office routine med refill policy (all routine meds to be refilled for 3 mo or monthly (per pt preference) up to one year from last visit, then month to month grace period for 3 mo, then further med refills will have to be denied) ? ?

## 2022-01-31 ENCOUNTER — Encounter: Payer: Self-pay | Admitting: Internal Medicine

## 2022-01-31 DIAGNOSIS — M19049 Primary osteoarthritis, unspecified hand: Secondary | ICD-10-CM | POA: Insufficient documentation

## 2022-01-31 NOTE — Assessment & Plan Note (Signed)
Lab Results  ?Component Value Date  ? [id number] 01/15/2022  ? ?Stable, cont oral replacement - b12 1000 mcg qd ? ?

## 2022-01-31 NOTE — Assessment & Plan Note (Signed)
Age and sex appropriate education and counseling updated with regular exercise and diet Referrals for preventative services - none needed Immunizations addressed - decines shingrix Smoking counseling  - none needed Evidence for depression or other mood disorder - none significant Most recent labs reviewed. I have personally reviewed and have noted: 1) the patient's medical and social history 2) The patient's current medications and supplements 3) The patient's height, weight, and BMI have been recorded in the chart  

## 2022-01-31 NOTE — Assessment & Plan Note (Signed)
Mild uncontrolled, for volt gel topical prn ?

## 2022-01-31 NOTE — Assessment & Plan Note (Signed)
Last vitamin D ?Lab Results  ?Component Value Date  ? VD25OH 25.83 (L) 01/15/2022  ? ?Low, for increased oral replacement to 2000 u qd ?

## 2022-01-31 NOTE — Assessment & Plan Note (Signed)
BP Readings from Last 3 Encounters:  ?01/27/22 112/60  ?01/16/21 132/76  ?12/18/20 106/70  ? ?Stable, pt to continue medical treatment avapro ? ?

## 2022-01-31 NOTE — Assessment & Plan Note (Signed)
Lab Results  ?Component Value Date  ? LDLCALC 91 01/15/2022  ? ?Stable, pt to continue current statin lipitor 20, also for card Ct score ? ?

## 2022-03-26 ENCOUNTER — Ambulatory Visit (INDEPENDENT_AMBULATORY_CARE_PROVIDER_SITE_OTHER)
Admission: RE | Admit: 2022-03-26 | Discharge: 2022-03-26 | Disposition: A | Discharge status: Home / Self Care | Payer: Self-pay | Source: Ambulatory Visit | Attending: Internal Medicine | Admitting: Internal Medicine

## 2022-03-26 ENCOUNTER — Other Ambulatory Visit: Payer: Self-pay | Admitting: Internal Medicine

## 2022-03-26 DIAGNOSIS — E7800 Pure hypercholesterolemia, unspecified: Secondary | ICD-10-CM

## 2022-03-26 DIAGNOSIS — I7121 Aneurysm of the ascending aorta, without rupture: Secondary | ICD-10-CM

## 2022-03-26 DIAGNOSIS — I10 Essential (primary) hypertension: Secondary | ICD-10-CM

## 2022-04-20 ENCOUNTER — Other Ambulatory Visit: Payer: BC Managed Care – PPO

## 2022-05-29 ENCOUNTER — Ambulatory Visit
Admission: RE | Admit: 2022-05-29 | Discharge: 2022-05-29 | Disposition: A | Discharge status: Home / Self Care | Payer: BC Managed Care – PPO | Source: Ambulatory Visit | Attending: Internal Medicine | Admitting: Internal Medicine

## 2022-05-29 DIAGNOSIS — I7121 Aneurysm of the ascending aorta, without rupture: Secondary | ICD-10-CM

## 2022-05-29 DIAGNOSIS — I517 Cardiomegaly: Secondary | ICD-10-CM | POA: Diagnosis not present

## 2022-05-29 DIAGNOSIS — J9811 Atelectasis: Secondary | ICD-10-CM | POA: Diagnosis not present

## 2022-05-29 DIAGNOSIS — R188 Other ascites: Secondary | ICD-10-CM | POA: Diagnosis not present

## 2022-05-29 DIAGNOSIS — I712 Thoracic aortic aneurysm, without rupture, unspecified: Secondary | ICD-10-CM | POA: Diagnosis not present

## 2022-05-29 MED ORDER — IOPAMIDOL (ISOVUE-370) INJECTION 76%
75.0000 mL | Freq: Once | INTRAVENOUS | Status: AC | PRN
  Administered 2022-05-29: 75 mL via INTRAVENOUS

## 2022-08-20 DIAGNOSIS — L814 Other melanin hyperpigmentation: Secondary | ICD-10-CM | POA: Diagnosis not present

## 2022-08-20 DIAGNOSIS — L821 Other seborrheic keratosis: Secondary | ICD-10-CM | POA: Diagnosis not present

## 2022-08-20 DIAGNOSIS — L578 Other skin changes due to chronic exposure to nonionizing radiation: Secondary | ICD-10-CM | POA: Diagnosis not present

## 2022-08-20 DIAGNOSIS — D229 Melanocytic nevi, unspecified: Secondary | ICD-10-CM | POA: Diagnosis not present

## 2022-08-20 DIAGNOSIS — L570 Actinic keratosis: Secondary | ICD-10-CM | POA: Diagnosis not present

## 2022-11-25 ENCOUNTER — Encounter: Payer: Self-pay | Admitting: Internal Medicine

## 2022-11-25 DIAGNOSIS — N200 Calculus of kidney: Secondary | ICD-10-CM

## 2022-11-25 DIAGNOSIS — K529 Noninfective gastroenteritis and colitis, unspecified: Secondary | ICD-10-CM

## 2022-12-30 DIAGNOSIS — N201 Calculus of ureter: Secondary | ICD-10-CM | POA: Diagnosis not present

## 2022-12-30 DIAGNOSIS — Z87442 Personal history of urinary calculi: Secondary | ICD-10-CM | POA: Diagnosis not present

## 2023-01-25 ENCOUNTER — Other Ambulatory Visit: Payer: Self-pay | Admitting: Internal Medicine

## 2023-02-02 ENCOUNTER — Encounter: Payer: Self-pay | Admitting: Gastroenterology

## 2023-02-02 ENCOUNTER — Other Ambulatory Visit: Payer: BC Managed Care – PPO

## 2023-02-02 ENCOUNTER — Ambulatory Visit: Payer: BC Managed Care – PPO | Admitting: Gastroenterology

## 2023-02-02 VITALS — BP 118/62 | HR 57 | Ht 68.0 in | Wt 176.0 lb

## 2023-02-02 DIAGNOSIS — K529 Noninfective gastroenteritis and colitis, unspecified: Secondary | ICD-10-CM | POA: Diagnosis not present

## 2023-02-02 DIAGNOSIS — K9041 Non-celiac gluten sensitivity: Secondary | ICD-10-CM

## 2023-02-02 DIAGNOSIS — Z800 Family history of malignant neoplasm of digestive organs: Secondary | ICD-10-CM | POA: Diagnosis not present

## 2023-02-02 DIAGNOSIS — R933 Abnormal findings on diagnostic imaging of other parts of digestive tract: Secondary | ICD-10-CM

## 2023-02-02 NOTE — Patient Instructions (Addendum)
Your provider has requested that you go to the basement level for lab work before leaving today. Press "B" on the elevator. The lab is located at the first door on the left as you exit the elevator.  A high fiber diet with plenty of fluids (up to 8 glasses of water daily) is suggested to relieve these symptoms.  Metamucil, 1 tablespoon once daily for one week, then increase to twice daily.   Follow up on :6 /11/24 at 2:30 pm   _______________________________________________________  If your blood pressure at your visit was 140/90 or greater, please contact your primary care physician to follow up on this.  _______________________________________________________  If you are age 53 or older, your body mass index should be between 23-30. Your Body mass index is 26.76 kg/m. If this is out of the aforementioned range listed, please consider follow up with your Primary Care Provider.  If you are age 80 or younger, your body mass index should be between 19-25. Your Body mass index is 26.76 kg/m. If this is out of the aformentioned range listed, please consider follow up with your Primary Care Provider.   ________________________________________________________  The [facility] providers would like to encourage you to use [facility] to communicate with providers for non-urgent requests or questions.  Due to long hold times on the telephone, sending your provider a message by [facility] may be a faster and more efficient way to get a response.  Please allow 48 business hours for a response.  Please remember that this is for non-urgent requests.  _______________________________________________________  Thank you for choosing me and [facility].  Dr. Meridee Score

## 2023-02-02 NOTE — Progress Notes (Signed)
GASTROENTEROLOGY OUTPATIENT CLINIC VISIT   Primary Care Provider Corwin Levins, MD [address] [place] [address] [phone]  Patient Profile: Jaime Curtis is a 62 y.o. male with a pmh significant for hypertension, hyperlipidemia, nephrolithiasis, family history colon cancer (mother), chronic diarrhea versus IBS-D.  The patient presents to the [facility] for an evaluation and management of problem(s) noted below:  Problem List 1. Chronic diarrhea   2. Family history of colon cancer   3. Gluten intolerance   4. Abnormal colonoscopy     History of Present Illness Please see prior GI notes for full details of HPI.  Interval History This is a patient of Dr. Christella Hartigan who has not been seen since 2016.  The patient states that he regularly continues to have looser bowel movements that he describes anywhere from soft to liquidy.  He has at least 1 if not 2 of these a day.  80% of the time his bowel movements will be more on the liquid side.  He is never noted any blood or mucus.  He does not have any fecal urgency or ever had an episode of fecal incontinence.  He has cut out gluten to see if it makes a difference and he is not sure if it has or has not.  He has never used bulking fibers.  He has had a family member, his mother, diagnosed with colon cancer since his last colon cancer screening in 2016.  Although the patient was seen in 2016, the notation had suggested that the patient had improvement in his bowel habits, though the patient states this is never really gotten better.  GI Review of Systems Positive as above Negative for dysphagia, odynophagia, nausea, vomiting, pain, melena, hematochezia  Review of Systems General: Denies fevers/chills/weight loss unintentionally HEENT: Denies oral lesions Cardiovascular: Denies chest pain Pulmonary: Denies shortness of breath Gastroenterological: See HPI Genitourinary: Denies darkened urine Hematological:  Denies easy bruising/bleeding Endocrine: Denies temperature intolerance Dermatological: Denies jaundice Psychological: Mood is stable   Medications Current Outpatient Medications  Medication Sig Dispense Refill   aspirin 81 MG tablet Take 81 mg by mouth daily.     atorvastatin (LIPITOR) 20 MG tablet TAKE 1 TABLET DAILY 90 tablet 3   irbesartan (AVAPRO) 75 MG tablet TAKE 1 TABLET DAILY 90 tablet 3   No current facility-administered medications for this visit.    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   HEMATOCHEZIA 10/03/2007   High blood pressure    HYPERLIPIDEMIA 12/07/2007   Hypertension 12/12/2016   NECK PAIN 11/18/2010   Pure hypercholesterolemia 08/18/2007   SINUSITIS- ACUTE-NOS 11/25/2009   URI 11/18/2010   Past Surgical History:  Procedure Laterality Date   baha bone anchored hearing aide Right    COLONOSCOPY     6-8 years ago   HERNIA MESH REMOVAL     hx of fractured right scapula     inguinal herniorrhapy Bilateral    left   TONSILLECTOMY     Social History   Socioeconomic History   Marital status: Married    Spouse name: Not on file  Number of children: 3   Years of education: Not on file   Highest education level: Not on file  Occupational History   Occupation: Field seismologist  Tobacco Use   Smoking status: Never   Smokeless tobacco: Never  Vaping Use   Vaping Use: Never used  Substance and Sexual Activity   Alcohol use: Yes    Alcohol/week: 0.0 standard drinks of alcohol    Comment: socially   Drug use: No   Sexual activity: Not on file  Other Topics Concern   Not on file  Social History Narrative   Not  on file   Social Determinants of Health   Financial Resource Strain: Not on file  Food Insecurity: Not on file  Transportation Needs: Not on file  Physical Activity: Not on file  Stress: Not on file  Social Connections: Not on file  Intimate Partner Violence: Not on file   Family History  Problem Relation Age of Onset   Colon cancer Mother 33       doing well s/p colon surgery   Sudden death Father    Diabetes Sister    Crohn's disease Sister    Heart attack Sister    Cancer Maternal Grandmother        type unknown   Rectal cancer Neg Hx    Stomach cancer Neg Hx    Liver disease Neg Hx    Esophageal cancer Neg Hx    Inflammatory bowel disease Neg Hx    Pancreatic cancer Neg Hx    I have reviewed his medical, social, and family history in detail and updated the electronic medical record as necessary.    PHYSICAL EXAMINATION  BP 118/62   Pulse (!) 57   Ht 5\' 8"  (1.727 m)   Wt 176 lb (79.8 kg)   BMI 26.76 kg/m  Wt Readings from Last 3 Encounters:  02/02/23 176 lb (79.8 kg)  01/27/22 179 lb (81.2 kg)  01/16/21 178 lb (80.7 kg)  GEN: NAD, appears stated age, doesn't appear chronically ill PSYCH: Cooperative, without pressured speech EYE: Conjunctivae pink, sclerae anicteric ENT: MMM CV: Nontachycardic RESP: No audible wheezing GI: NABS, soft, NT/ND, without rebound or guarding, no HSM appreciated MSK/EXT: No significant lower extremity edema SKIN: No jaundice NEURO:  Alert & Oriented x 3, no focal deficits   REVIEW OF DATA  I reviewed the following data at the time of this encounter:  GI Procedures and Studies  2016 colonoscopy The terminal ileum was normal.  The colon mucosa was diffusely granular, slightly nodular.  This did not appear neoplastic, biopsies were taken randomly throughout the right and left colon.  The rectum was erythematous, slightly inflamed appearing tapering to normal mucosa at 15 cm.  The rectum was biopsied and sent to pathology.  The  examination was otherwise normal.  Pathology Diagnosis 1. Surgical [P], random sites (surface irregularity/nodularity) - FOCAL ACTIVE COLITIS. - SEE MICROSCOPIC DESCRIPTION. 2. Surgical [P], rectum - FOCAL ACTIVE COLITIS. - SEE MICROSCOPIC DESCRIPTION. Microscopic Comment 1. There is increased infiltrate in the lamina propria with few foci of mild active neutrophilic inflammation. There is also focal, mild alteration of the crypt architecture and the findings suggest chronic, mildly active inflammatory bowel disease.and clinical correlation is essential. 2. The rectal biopsies show a few foci of mild active neutrophilic inflammation with slightly increased infiltrate in the lamina propria. There are few foci of mildly increased basal plasma cells and while the findings are not definitive. The features suggest chronic minimally  active inflammatory bowel disease. (JDP:gt, 12/11/14)  Laboratory Studies  Reviewed those in epic  Imaging Studies  No relevant studies to review   ASSESSMENT  Mr. Rupley is a 62 y.o. male with a pmh significant for hypertension, hyperlipidemia, nephrolithiasis, family history colon cancer (mother), chronic diarrhea versus IBS-D. The patient is seen today for evaluation and management of:  1. Chronic diarrhea   2. Family history of colon cancer   3. Gluten intolerance   4. Abnormal colonoscopy    The patient is hemodynamically stable.  Clinically, he is not had significant changes overall the last few years since his last GI evaluation with Dr. Christella Hartigan.  Is interesting that he did have evidence of active colitis on both his colon as well as rectal biopsies no overt chronic changes of suggestive of true inflammatory bowel disease were not present.  I wonder if this was the very beginning or onset of some mild changes.  He is due for colon cancer screening with a diagnosis of his mother having had colon cancer and should remain on a 5-year plan.  With this being  said, I do think we need to repeat biopsies.  We are going to try to bulk his stool with fiber bulking agents to see if that makes a difference for him.  Will also get a do stool studies as well.  If his fecal calprotectin is elevated or he has any other abnormalities found, then we will certainly consider if we can hold on colonoscopy or not.  I suspect things will be normal and we will just plan to do repeat colonoscopy and biopsies this summer.  But see where things go in the coming months and then decide upon colonoscopy timing.  All patient questions were answered to the best of my ability, and the patient agrees to the aforementioned plan of action with follow-up as indicated.   PLAN  Laboratories as outlined below Stool studies as outlined below If workup above is unremarkable then will need colonoscopy for screening and biopsies to ensure no evidence of IBD or microscopic/collagenous colitis If elevated fecal calprotectin we will consider earlier colonoscopy If elevated celiac serologies then will need upper endoscopy Open fiber with Metamucil or Benefiber or Citrucel - Daily for 1 to 2 weeks and then increase to twice daily dosing thereafter   Orders Placed This Encounter  Procedures   Stool Culture   Clostridium difficile Toxin B, Qualitative, Real-Time PCR   Calprotectin, Fecal   IgA   Tissue transglutaminase, IgA   Pancreatic elastase, fecal   Fecal fat, qualitative    New Prescriptions   No medications on file   Modified Medications   No medications on file    Planned Follow Up No follow-ups on file.   Total Time in Face-to-Face and in Coordination of Care for patient including independent/personal interpretation/review of prior testing, medical history, examination, medication adjustment, communicating results with the patient directly, and documentation within the EHR is 45 minutes.   Corliss Parish, MD [facility] Advanced Endoscopy Office #  [phone]

## 2023-02-03 ENCOUNTER — Encounter: Payer: BC Managed Care – PPO | Admitting: Internal Medicine

## 2023-02-04 ENCOUNTER — Encounter: Payer: Self-pay | Admitting: Gastroenterology

## 2023-02-04 DIAGNOSIS — R933 Abnormal findings on diagnostic imaging of other parts of digestive tract: Secondary | ICD-10-CM | POA: Insufficient documentation

## 2023-02-04 DIAGNOSIS — K9041 Non-celiac gluten sensitivity: Secondary | ICD-10-CM | POA: Insufficient documentation

## 2023-02-04 DIAGNOSIS — K529 Noninfective gastroenteritis and colitis, unspecified: Secondary | ICD-10-CM | POA: Insufficient documentation

## 2023-02-09 ENCOUNTER — Other Ambulatory Visit (INDEPENDENT_AMBULATORY_CARE_PROVIDER_SITE_OTHER): Payer: BC Managed Care – PPO

## 2023-02-09 DIAGNOSIS — Z0001 Encounter for general adult medical examination with abnormal findings: Secondary | ICD-10-CM

## 2023-02-09 DIAGNOSIS — Z800 Family history of malignant neoplasm of digestive organs: Secondary | ICD-10-CM

## 2023-02-09 DIAGNOSIS — K529 Noninfective gastroenteritis and colitis, unspecified: Secondary | ICD-10-CM | POA: Diagnosis not present

## 2023-02-09 DIAGNOSIS — E7800 Pure hypercholesterolemia, unspecified: Secondary | ICD-10-CM

## 2023-02-09 DIAGNOSIS — E538 Deficiency of other specified B group vitamins: Secondary | ICD-10-CM | POA: Diagnosis not present

## 2023-02-09 DIAGNOSIS — E559 Vitamin D deficiency, unspecified: Secondary | ICD-10-CM

## 2023-02-09 DIAGNOSIS — K9041 Non-celiac gluten sensitivity: Secondary | ICD-10-CM | POA: Diagnosis not present

## 2023-02-09 LAB — PSA: PSA: 0.77 ng/mL (ref 0.10–4.00)

## 2023-02-09 LAB — URINALYSIS, ROUTINE W REFLEX MICROSCOPIC
Bilirubin Urine: NEGATIVE
Hgb urine dipstick: NEGATIVE
Ketones, ur: NEGATIVE
Leukocytes,Ua: NEGATIVE
Nitrite: NEGATIVE
Specific Gravity, Urine: 1.025 (ref 1.000–1.030)
Total Protein, Urine: NEGATIVE
Urine Glucose: NEGATIVE
Urobilinogen, UA: 1 (ref 0.0–1.0)
pH: 6 (ref 5.0–8.0)

## 2023-02-09 LAB — CBC WITH DIFFERENTIAL/PLATELET
Basophils Absolute: 0.1 10*3/uL (ref 0.0–0.1)
Basophils Relative: 1 % (ref 0.0–3.0)
Eosinophils Absolute: 0.1 10*3/uL (ref 0.0–0.7)
Eosinophils Relative: 1.6 % (ref 0.0–5.0)
HCT: 44.7 % (ref 39.0–52.0)
Hemoglobin: 15.2 g/dL (ref 13.0–17.0)
Lymphocytes Relative: 18 % (ref 12.0–46.0)
Lymphs Abs: 1.4 10*3/uL (ref 0.7–4.0)
MCHC: 33.9 g/dL (ref 30.0–36.0)
MCV: 93.1 fl (ref 78.0–100.0)
Monocytes Absolute: 0.7 10*3/uL (ref 0.1–1.0)
Monocytes Relative: 9.2 % (ref 3.0–12.0)
Neutro Abs: 5.3 10*3/uL (ref 1.4–7.7)
Neutrophils Relative %: 70.2 % (ref 43.0–77.0)
Platelets: 232 10*3/uL (ref 150.0–400.0)
RBC: 4.8 Mil/uL (ref 4.22–5.81)
RDW: 14.3 % (ref 11.5–15.5)
WBC: 7.5 10*3/uL (ref 4.0–10.5)

## 2023-02-09 LAB — HEPATIC FUNCTION PANEL
ALT: 16 U/L (ref 0–53)
AST: 21 U/L (ref 0–37)
Albumin: 3.9 g/dL (ref 3.5–5.2)
Alkaline Phosphatase: 50 U/L (ref 39–117)
Bilirubin, Direct: 0.1 mg/dL (ref 0.0–0.3)
Total Bilirubin: 0.6 mg/dL (ref 0.2–1.2)
Total Protein: 5.9 g/dL — ABNORMAL LOW (ref 6.0–8.3)

## 2023-02-09 LAB — BASIC METABOLIC PANEL
BUN: 17 mg/dL (ref 6–23)
CO2: 28 mEq/L (ref 19–32)
Calcium: 8.8 mg/dL (ref 8.4–10.5)
Chloride: 106 mEq/L (ref 96–112)
Creatinine, Ser: 1.07 mg/dL (ref 0.40–1.50)
GFR: 75.34 mL/min (ref >60.00)
Glucose, Bld: 88 mg/dL (ref 70–99)
Potassium: 4.4 mEq/L (ref 3.5–5.1)
Sodium: 140 mEq/L (ref 135–145)

## 2023-02-09 LAB — LIPID PANEL
Cholesterol: 141 mg/dL (ref 0–200)
HDL: 43.6 mg/dL (ref >39.00)
LDL Cholesterol: 88 mg/dL (ref 0–99)
NonHDL: 97.85
Total CHOL/HDL Ratio: 3
Triglycerides: 50 mg/dL (ref 0.0–149.0)
VLDL: 10 mg/dL (ref 0.0–40.0)

## 2023-02-09 LAB — VITAMIN D 25 HYDROXY (VIT D DEFICIENCY, FRACTURES): VITD: 45.9 ng/mL (ref 30.00–100.00)

## 2023-02-09 LAB — VITAMIN B12: Vitamin B-12: 260 pg/mL (ref 211–911)

## 2023-02-09 LAB — TSH: TSH: 1.45 u[IU]/mL (ref 0.35–5.50)

## 2023-02-10 LAB — CLOSTRIDIUM DIFFICILE TOXIN B, QUALITATIVE, REAL-TIME PCR: Toxigenic C. Difficile by PCR: NOT DETECTED

## 2023-02-10 LAB — IGA: Immunoglobulin A: 70 mg/dL (ref 47–310)

## 2023-02-10 LAB — TISSUE TRANSGLUTAMINASE, IGA: (tTG) Ab, IgA: <1 U/mL

## 2023-02-12 ENCOUNTER — Ambulatory Visit: Payer: BC Managed Care – PPO | Admitting: Physician Assistant

## 2023-02-13 LAB — STOOL CULTURE: E coli, Shiga toxin Assay: NEGATIVE

## 2023-02-15 LAB — PANCREATIC ELASTASE, FECAL: Pancreatic Elastase-1, Stool: >500 mcg/g

## 2023-02-16 ENCOUNTER — Encounter: Payer: Self-pay | Admitting: Internal Medicine

## 2023-02-16 ENCOUNTER — Ambulatory Visit (INDEPENDENT_AMBULATORY_CARE_PROVIDER_SITE_OTHER): Payer: BC Managed Care – PPO | Admitting: Internal Medicine

## 2023-02-16 ENCOUNTER — Other Ambulatory Visit: Payer: Self-pay

## 2023-02-16 VITALS — BP 122/78 | HR 56 | Temp 98.4°F | Ht 68.0 in | Wt 176.0 lb

## 2023-02-16 DIAGNOSIS — Z125 Encounter for screening for malignant neoplasm of prostate: Secondary | ICD-10-CM | POA: Diagnosis not present

## 2023-02-16 DIAGNOSIS — K529 Noninfective gastroenteritis and colitis, unspecified: Secondary | ICD-10-CM

## 2023-02-16 DIAGNOSIS — I7121 Aneurysm of the ascending aorta, without rupture: Secondary | ICD-10-CM

## 2023-02-16 DIAGNOSIS — M19049 Primary osteoarthritis, unspecified hand: Secondary | ICD-10-CM | POA: Diagnosis not present

## 2023-02-16 DIAGNOSIS — R739 Hyperglycemia, unspecified: Secondary | ICD-10-CM | POA: Diagnosis not present

## 2023-02-16 DIAGNOSIS — Z0001 Encounter for general adult medical examination with abnormal findings: Secondary | ICD-10-CM | POA: Diagnosis not present

## 2023-02-16 DIAGNOSIS — E559 Vitamin D deficiency, unspecified: Secondary | ICD-10-CM

## 2023-02-16 DIAGNOSIS — E538 Deficiency of other specified B group vitamins: Secondary | ICD-10-CM | POA: Diagnosis not present

## 2023-02-16 DIAGNOSIS — I10 Essential (primary) hypertension: Secondary | ICD-10-CM

## 2023-02-16 DIAGNOSIS — R933 Abnormal findings on diagnostic imaging of other parts of digestive tract: Secondary | ICD-10-CM

## 2023-02-16 DIAGNOSIS — E7800 Pure hypercholesterolemia, unspecified: Secondary | ICD-10-CM | POA: Diagnosis not present

## 2023-02-16 DIAGNOSIS — Z800 Family history of malignant neoplasm of digestive organs: Secondary | ICD-10-CM

## 2023-02-16 LAB — CALPROTECTIN, FECAL: Calprotectin, Fecal: 166 ug/g — ABNORMAL HIGH (ref 0–120)

## 2023-02-16 MED ORDER — ATORVASTATIN CALCIUM 40 MG PO TABS
40.0000 mg | ORAL_TABLET | Freq: Every day | ORAL | 3 refills | Status: DC
Start: 2023-02-16 — End: 2024-03-02

## 2023-02-16 MED ORDER — IRBESARTAN 75 MG PO TABS: ORAL_TABLET | ORAL | 3 refills | Status: DC

## 2023-02-16 NOTE — Progress Notes (Unsigned)
Patient ID: Jaime Curtis, male   DOB: May 25, 1961, 62 y.o.   MRN: [id number]         Chief Complaint:: wellness exam and ascending aortic aneurysm, hld, low b12, chronic diarrhea, right thumb cmc pain arthritis       HPI:  Jaime Curtis is a 62 y.o. male here for wellness exam; for shingrix at pharmacy, declines covid booster, o/w up to date                         Also s/p covid infection 2023 cold like untreated without complication, now resolved.  Also had renal stone feb 2024 tx with Dr Lynnae Sandhoff urology.  Also seen per GI with elevated calprotectin so suggested he need endoscopy with chronic diarrhea persistent.  Due for Cta aorta in aug 2024.  Trying to follow lower chol diet, and good compliance with lipitor 20.  Was taking B12, then not and willing to restart.  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    Wt Readings from Last 3 Encounters:  02/16/23 176 lb (79.8 kg)  02/02/23 176 lb (79.8 kg)  01/27/22 179 lb (81.2 kg)   BP Readings from Last 3 Encounters:  02/16/23 122/78  02/02/23 118/62  01/27/22 112/60   Immunization History  Administered Date(s) Administered   Influenza Inj Mdck Quad Pf 08/06/2020   Influenza Split 07/10/2011, 08/01/2012   Influenza Whole 07/07/2010   Influenza,inj,Quad PF,6+ Mos 08/12/2014, 07/30/2019, 08/09/2022   Influenza-Unspecified 07/26/2016, 06/18/2017, 07/26/2018   PFIZER(Purple Top)SARS-COV-2 Vaccination 11/16/2019, 12/07/2019, 08/10/2020, 05/16/2021, 07/17/2021   Td 07/01/2009   Tdap 01/16/2020   Health Maintenance Due  Topic Date Due   Zoster Vaccines- Shingrix (1 of 2) Never done      Past Medical History:  Diagnosis Date   ALLERGIC RHINITIS 12/07/2007   Allergy    Bradycardia, sinus 07/10/2011   CHEST PAIN 12/07/2007   DARK URINE 12/07/2007   Family history of colon cancer 08/11/2013   mother, age 76   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 He has never used smokeless tobacco. He reports current alcohol use. He reports that he does not use drugs. family history includes Cancer in his maternal grandmother; Colon cancer (age of onset: 34) in his mother; Crohn's disease in his sister; Diabetes in his sister; Heart attack in his sister; Sudden death in his father. Allergies  Allergen Reactions   Pseudoephedrine Other (See Comments)   Current Outpatient Medications on File Prior to Visit  Medication Sig Dispense Refill   aspirin 81 MG tablet Take 81 mg by mouth daily.     No current facility-administered medications on file prior to visit.        ROS:  All others reviewed and negative.  Objective  PE:  BP 122/78 (BP Location: Right Arm, Patient Position: Sitting, Cuff Size: Normal)   Pulse (!) 56   Temp 98.4 F (36.9 C) (Oral)   Ht 5\' 8"  (1.727 m)   Wt 176 lb (79.8 kg)   SpO2 97%   BMI 26.76 kg/m                 Constitutional: Pt appears in NAD               HENT: Head: NCAT.                Right Ear: External ear normal.                 Left Ear: External ear normal.                Eyes: . Pupils are equal, round, and reactive to light. Conjunctivae and EOM are normal               Nose: without d/c or deformity               Neck: Neck supple. Gross normal ROM               Cardiovascular: Normal rate and regular rhythm.                 Pulmonary/Chest: Effort normal and breath sounds without rales or wheezing.                Abd:   Soft, NT, ND, + BS, no organomegaly               Neurological: Pt is alert. At baseline orientation, motor grossly intact               Skin: Skin is warm. No rashes, no other new lesions, LE edema - none               Psychiatric: Pt behavior is normal without agitation   Micro: none  Cardiac tracings I have personally interpreted today:  none  Pertinent Radiological findings (summarize): none   Lab Results  Component Value Date   WBC 7.5 02/09/2023   HGB 15.2 02/09/2023   HCT 44.7 02/09/2023   PLT 232.0 02/09/2023   GLUCOSE 88 02/09/2023   CHOL 141 02/09/2023   TRIG 50.0 02/09/2023   HDL 43.60 02/09/2023   LDLCALC 88 02/09/2023   ALT 16 02/09/2023   AST 21 02/09/2023   NA 140 02/09/2023   K 4.4 02/09/2023   CL 106 02/09/2023   CREATININE 1.07 02/09/2023   BUN 17 02/09/2023   CO2 28 02/09/2023   TSH 1.45 02/09/2023   PSA 0.77 02/09/2023   MICROALBUR 1.1 02/20/2016   Assessment/Plan:  Jaime Curtis is a 62 y.o. White or Caucasian [1] male with  has a past medical history of ALLERGIC RHINITIS (12/07/2007), Allergy, Bradycardia, sinus (07/10/2011), CHEST PAIN (12/07/2007), DARK URINE (12/07/2007), Family history of colon cancer (08/11/2013), Hearing loss in right ear, HEMATOCHEZIA (10/03/2007), High blood pressure, HYPERLIPIDEMIA (12/07/2007), Hypertension (12/12/2016), NECK PAIN (11/18/2010), Pure hypercholesterolemia (08/18/2007), SINUSITIS- ACUTE-NOS (11/25/2009), and URI (11/18/2010).  Encounter for well adult exam with abnormal findings Age and sex appropriate education and counseling updated with regular exercise and diet Referrals for preventative services - none needed Immunizations addressed - for shignrix at pharmacy, declines covid booster Smoking counseling  - none needed Evidence for depression or other mood disorder - none significant Most recent labs reviewed. 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 recently - for volt gel prn  Hypertension BP Readings from Last 3 Encounters:  02/16/23 122/78  02/02/23 118/62  01/27/22 112/60   Stable, pt to continue medical treatment avapro 75 qd  Pure hypercholesterolemia Lab Results  Component Value Date   LDLCALC 88 02/09/2023   Uncontrolled, goal ldl < 70, pt to increase lipitor 40 qd   Vitamin D deficiency Last vitamin D Lab Results  Component Value Date   VD25OH 45.90 02/09/2023   Stable, cont oral replacement   Chronic diarrhea Ongoing, for f/u GI for endoscopy soon  Aneurysm of ascending aorta without rupture (HCC) For f/u CTA aorta in aug 2024  Followup: Return in about 1 year (around 02/16/2024).  Oliver Barre, MD 02/17/2023 7:48 PM [facility]

## 2023-02-16 NOTE — Patient Instructions (Addendum)
Please have your Shingrix (shingles) shots done at your local pharmacy.  Ok to increase the lipitor to 40 mg per day  Ok to restart the OTC B12 at 1000 mcg per day indefinitely due to the small bowel problem  Ok to use the Voltaren gel as needed for the thumb pain arthritis - but call if you would want to see hand Surgury such as Dr Amanda Pea  Please continue all other medications as before, and refills have been done if requested.  Please have the pharmacy call with any other refills you may need.  Please continue your efforts at being more active, low cholesterol diet, and weight control.  You are otherwise up to date with prevention measures today.  Please keep your appointments with your specialists as you may have planned - cardiology this summer, and GI soon for the endoscopy  You will be contacted regarding the referral for: CTA aorta in August 2024  Your lab testing was otherwise good  Please make an Appointment to return for your 1 year visit, or sooner if needed, with Lab testing by Appointment as well, to be done about 3-5 days before at the FIRST FLOOR Lab (so this is for TWO appointments - please see the scheduling desk as you leave)

## 2023-02-17 ENCOUNTER — Encounter: Payer: Self-pay | Admitting: Internal Medicine

## 2023-02-17 DIAGNOSIS — I7121 Aneurysm of the ascending aorta, without rupture: Secondary | ICD-10-CM | POA: Insufficient documentation

## 2023-02-17 LAB — FECAL FAT, QUALITATIVE
Fat Qual Neutral, Stl: NORMAL
Fat Qual Total, Stl: NORMAL

## 2023-02-17 NOTE — Assessment & Plan Note (Signed)
Last vitamin D Lab Results  Component Value Date   VD25OH 45.90 02/09/2023   Stable, cont oral replacement

## 2023-02-17 NOTE — Assessment & Plan Note (Signed)
Lab Results  Component Value Date   LDLCALC 88 02/09/2023   Uncontrolled, goal ldl < 70, pt to increase lipitor 40 qd

## 2023-02-17 NOTE — Assessment & Plan Note (Signed)
Age and sex appropriate education and counseling updated with regular exercise and diet Referrals for preventative services - none needed Immunizations addressed - for shignrix at pharmacy, declines covid booster Smoking counseling  - none needed Evidence for depression or other mood disorder - none significant Most recent labs reviewed. I have personally reviewed and have noted: 1) the patient's medical and social history 2) The patient's current medications and supplements 3) The patient's height, weight, and BMI have been recorded in the chart  

## 2023-02-17 NOTE — Assessment & Plan Note (Signed)
Ongoing, for f/u GI for endoscopy soon

## 2023-02-17 NOTE — Assessment & Plan Note (Signed)
For f/u CTA aorta in aug 2024

## 2023-02-17 NOTE — Assessment & Plan Note (Signed)
With right > left cmc pain worsening recently - for volt gel prn

## 2023-02-17 NOTE — Assessment & Plan Note (Signed)
BP Readings from Last 3 Encounters:  02/16/23 122/78  02/02/23 118/62  01/27/22 112/60   Stable, pt to continue medical treatment avapro 75 qd

## 2023-02-17 NOTE — Assessment & Plan Note (Signed)
Lab Results  Component Value Date   VITAMINB12 260 02/09/2023   Low, to start oral replacement - b12 1000 mcg qd

## 2023-02-18 ENCOUNTER — Telehealth: Payer: Self-pay | Admitting: Gastroenterology

## 2023-02-18 NOTE — Telephone Encounter (Signed)
Went over lab results with patient ( per results sent in my chart). Informed patient again that the only lab that Dr Judie Petit suggested repeating is fecal cal in 6-8 weeks. Patient will keep appt 03/23/23 and voiced understanding.

## 2023-02-18 NOTE — Telephone Encounter (Signed)
Patient called stating that he received his recent lab work results in [employer]. He is requesting a call back to discuss results, and to see if anything needs to be done prior to his appointment on 06/11. Please advise, thank you.

## 2023-03-23 ENCOUNTER — Ambulatory Visit: Payer: BC Managed Care – PPO | Admitting: Gastroenterology

## 2023-03-23 ENCOUNTER — Other Ambulatory Visit: Payer: BC Managed Care – PPO

## 2023-03-23 VITALS — BP 122/70 | HR 52 | Ht 68.0 in | Wt 179.0 lb

## 2023-03-23 DIAGNOSIS — Z8379 Family history of other diseases of the digestive system: Secondary | ICD-10-CM | POA: Diagnosis not present

## 2023-03-23 DIAGNOSIS — K529 Noninfective gastroenteritis and colitis, unspecified: Secondary | ICD-10-CM

## 2023-03-23 DIAGNOSIS — K9041 Non-celiac gluten sensitivity: Secondary | ICD-10-CM | POA: Diagnosis not present

## 2023-03-23 DIAGNOSIS — Z800 Family history of malignant neoplasm of digestive organs: Secondary | ICD-10-CM | POA: Diagnosis not present

## 2023-03-23 NOTE — Patient Instructions (Signed)
Your provider has requested that you go to the basement level for lab work before leaving today. Press "B" on the elevator. The lab is located at the first door on the left as you exit the elevator.  It has been recommended to you by your physician that you have a(n) Colon/EGD completed. Per your request, we did not schedule the procedure(s) today. Please contact our office at [phone] should you decide to have the procedure completed.   Due to recent changes in healthcare laws, you may see the results of your imaging and laboratory studies on MyChart before your provider has had a chance to review them.  We understand that in some cases there may be results that are confusing or concerning to you. Not all laboratory results come back in the same time frame and the provider may be waiting for multiple results in order to interpret others.  Please give [place] 48 hours in order for your provider to thoroughly review all the results before contacting the office for clarification of your results.   _______________________________________________________  If your blood pressure at your visit was 140/90 or greater, please contact your primary care physician to follow up on this.  _______________________________________________________  If you are age 80 or older, your body mass index should be between 23-30. Your Body mass index is 27.22 kg/m. If this is out of the aforementioned range listed, please consider follow up with your Primary Care Provider.  If you are age 57 or younger, your body mass index should be between 19-25. Your Body mass index is 27.22 kg/m. If this is out of the aformentioned range listed, please consider follow up with your Primary Care Provider.   ________________________________________________________  The [facility] providers would like to encourage you to use [facility] to communicate with providers for non-urgent requests or questions.  Due to long hold times on the telephone,  sending your provider a message by [facility] may be a faster and more efficient way to get a response.  Please allow 48 business hours for a response.  Please remember that this is for non-urgent requests.  _______________________________________________________  Thank you for choosing me and [facility].  Dr. Meridee Score

## 2023-03-23 NOTE — Progress Notes (Signed)
GASTROENTEROLOGY OUTPATIENT CLINIC VISIT   Primary Care Provider Corwin Levins, MD [address] [place] [address] [phone]  Patient Profile: Jaime Curtis is a 62 y.o. male with a pmh significant for hypertension, hyperlipidemia, nephrolithiasis, family history colon cancer (mother), chronic diarrhea versus IBS-D.  The patient presents to the [facility] for an evaluation and management of problem(s) noted below:  Problem List 1. Chronic diarrhea   2. Family history of celiac disease   3. Gluten intolerance   4. Family history of colon cancer      History of Present Illness Please see prior GI notes for full details of HPI.  Interval History Patient returns for follow-up.  Fiber bulking has been very minimally successful (he is only been doing it once but sometimes twice daily.  Fecal calprotectin was elevated.  No blood in stools.  No new abdominal pain or discomfort.  He wonders about his testing from a celiac standpoint because he had been somewhat following a gluten free diet, though was not perfect.  He wonders whether he should be retested because of his significant family history and persisting symptoms, he is not following a gluten-free diet currently.   GI Review of Systems Positive as above Negative for dysphagia, odynophagia, nausea, vomiting, pain, melena, hematochezia  Review of Systems General: Denies fevers/chills/weight loss unintentionally Cardiovascular: Denies chest pain Pulmonary: Denies shortness of breath Gastroenterological: See HPI Genitourinary: Denies darkened urine Hematological: Denies easy bruising/bleeding Dermatological: Denies jaundice Psychological: Mood is stable   Medications Current Outpatient Medications  Medication Sig Dispense Refill   aspirin 81 MG tablet Take 81 mg by mouth daily.     atorvastatin (LIPITOR) 40 MG tablet Take 1 tablet (40 mg total) by mouth daily. 90 tablet 3   irbesartan  (AVAPRO) 75 MG tablet TAKE 1 TABLET DAILY 90 tablet 3   No current facility-administered medications for this visit.    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   HEMATOCHEZIA 10/03/2007   High blood pressure    HYPERLIPIDEMIA 12/07/2007   Hypertension 12/12/2016   NECK PAIN 11/18/2010   Pure hypercholesterolemia 08/18/2007   SINUSITIS- ACUTE-NOS 11/25/2009   URI 11/18/2010   Past Surgical History:  Procedure Laterality Date   baha bone anchored hearing aide Right    COLONOSCOPY     6-8 years ago   HERNIA MESH REMOVAL     hx of fractured right scapula     inguinal herniorrhapy Bilateral    left   TONSILLECTOMY     Social History   Socioeconomic History   Marital status: Married    Spouse name: Not on file   Number of children: 3   Years of education: Not on file   Highest education level: Not on file  Occupational History   Occupation: Field seismologist  Tobacco Use   Smoking status: Never   Smokeless tobacco: Never  Vaping Use   Vaping Use: Never used  Substance and Sexual Activity   Alcohol use: Yes    Alcohol/week: 0.0 standard drinks of alcohol    Comment: socially  Drug use: No   Sexual activity: Not on file  Other Topics Concern   Not on file  Social History Narrative   Not on file   Social Determinants of Health   Financial Resource Strain: Not on file  Food Insecurity: Not on file  Transportation Needs: Not on file  Physical Activity: Not on file  Stress: Not on file  Social Connections: Not on file  Intimate Partner Violence: Not on file   Family History  Problem Relation Age of Onset    Colon cancer Mother 27       doing well s/p colon surgery   Sudden death Father    Diabetes Sister    Crohn's disease Sister    Heart attack Sister    Cancer Maternal Grandmother        type unknown   Rectal cancer Neg Hx    Stomach cancer Neg Hx    Liver disease Neg Hx    Esophageal cancer Neg Hx    Inflammatory bowel disease Neg Hx    Pancreatic cancer Neg Hx    I have reviewed his medical, social, and family history in detail and updated the electronic medical record as necessary.    PHYSICAL EXAMINATION  BP 122/70   Pulse (!) 52   Ht 5\' 8"  (1.727 m)   Wt 179 lb (81.2 kg)   BMI 27.22 kg/m  Wt Readings from Last 3 Encounters:  03/23/23 179 lb (81.2 kg)  02/16/23 176 lb (79.8 kg)  02/02/23 176 lb (79.8 kg)  GEN: NAD, appears stated age, doesn't appear chronically ill PSYCH: Cooperative, without pressured speech EYE: Conjunctivae pink, sclerae anicteric ENT: MMM CV: Nontachycardic RESP: No audible wheezing GI: NABS, soft, NT/ND, without rebound MSK/EXT: No significant lower extremity edema SKIN: No jaundice NEURO:  Alert & Oriented x 3, no focal deficits   REVIEW OF DATA  I reviewed the following data at the time of this encounter:  GI Procedures and Studies  No new studies to review  Laboratory Studies  Reviewed those in epic  Imaging Studies  No relevant studies to review   ASSESSMENT  Mr. Jaime Curtis is a 62 y.o. male with a pmh significant for hypertension, hyperlipidemia, nephrolithiasis, family history colon cancer (mother), chronic diarrhea versus IBS-D. The patient is seen today for evaluation and management of:  1. Chronic diarrhea   2. Family history of celiac disease   3. Gluten intolerance   4. Family history of colon cancer    The patient remains hemodynamically stable.  Clinically no significant change with fiber bulking.  Due to his family history and now not following a gluten-free diet, we will plan to retest him for celiac based on lab  serologies.  With elevated fecal calprotectin, will need to consider inflammatory bowel disease as we have previously noted and with his prior history of active colitis we do wonder about this.  Will plan diagnostic colonoscopy with likely endoscopy.  Certainly if the patient's celiac testing returns positive duodenal biopsies will be helpful to ensure no evidence of celiac.  However enteropathies may still be possible, so we may need to consider this.  SIBO breath testing needs to be considered as well if his colonoscopic evaluation is unremarkable.  IBS-D still possibility too.  The risks and benefits of endoscopic evaluation were discussed with the patient; these include but are not limited to the risk of perforation, infection, bleeding, missed lesions, lack of diagnosis, severe illness requiring hospitalization, as well as anesthesia and  sedation related illnesses.  The patient and/or family is agreeable to proceed.  All patient questions were answered to the best of my ability, and the patient agrees to the aforementioned plan of action with follow-up as indicated.   PLAN  Proceed with scheduling colonoscopy for screening purposes due to family history and for diagnostic purposes due to his chronic diarrhea Proceed with scheduling upper endoscopy to rule out enteropathy Follow-up celiac retesting now that he is not on GFD to see if elevated May continue fiber supplementation at twice daily dosing and increase up to 3 times daily, but hold a week before colonoscopy Consider SIBO breath testing in future IBS-D treatment should be considered as well pending workup shows no active IBD   Orders Placed This Encounter  Procedures   IgA   Tissue transglutaminase, IgA    New Prescriptions   No medications on file   Modified Medications   No medications on file    Planned Follow Up No follow-ups on file.   Total Time in Face-to-Face and in Coordination of Care for patient including  independent/personal interpretation/review of prior testing, medical history, examination, medication adjustment, communicating results with the patient directly, and documentation within the EHR is 25 minutes.   Corliss Parish, MD [facility] Advanced Endoscopy Office # [phone]

## 2023-03-24 LAB — TISSUE TRANSGLUTAMINASE, IGA: (tTG) Ab, IgA: <1 U/mL

## 2023-03-24 LAB — IGA: Immunoglobulin A: 68 mg/dL (ref 47–310)

## 2023-03-27 ENCOUNTER — Encounter: Payer: Self-pay | Admitting: Gastroenterology

## 2023-03-27 DIAGNOSIS — Z8379 Family history of other diseases of the digestive system: Secondary | ICD-10-CM | POA: Insufficient documentation

## 2023-03-29 ENCOUNTER — Telehealth: Payer: Self-pay | Admitting: Gastroenterology

## 2023-03-29 ENCOUNTER — Encounter: Payer: Self-pay | Admitting: Gastroenterology

## 2023-03-29 NOTE — Telephone Encounter (Signed)
EGD/Colon scheduled for 05/26/2023 130pm

## 2023-03-29 NOTE — Telephone Encounter (Signed)
Yes he needs both EGD and colon. Please set up previsit and colon. Thank you

## 2023-03-29 NOTE — Telephone Encounter (Signed)
PT was seen 6/11 and was to have an EGD scheduled but should he also be scheduled for a colonoscopy considering his recall is for 2026. Please advise

## 2023-04-22 NOTE — Progress Notes (Signed)
  Cardiology Office Note:    Date:  04/23/2023  ID:  Jaime Curtis, DOB [birth year], MRN [id number] PCP: Corwin Levins, MD  [facility] Providers Cardiologist:  Verne Carrow, MD       Patient Profile:      Hypertension  Hyperlipidemia  Bradycardia  ETT 01/06/2017: Negative stress test without ischemia TTE 12/29/2016: Mild LVH, EF 55-60, normal wall motion, mild LAE FHx of sudden cardiac death   Coronary artery Ca2+ CAC score 03/26/2022: CAC score 0.814 (33rd percentile), dilated ascending aorta 4.2 cm Dilated ascending thoracic aorta CTA 05/29/2022: 4 cm      History of Present Illness:   Jaime Curtis is a 62 y.o. male who returns for follow-up of bradycardia.  He was last seen by Dr. Clifton Curtis in March 2022. He is here alone. He is doing well. He still exercises on a regular basis. He has not had chest pain, shortness of breath, syncope.   ROS See HPI    Studies Reviewed:   EKG Interpretation Date/Time:  Friday April 23 2023 08:39:09 EDT Ventricular Rate:  51 PR Interval:  160 QRS Duration:  88 QT Interval:  402 QTC Calculation: 370 R Axis:   -54  Text Interpretation: Sinus bradycardia Left axis deviation Septal infarct , age undetermined No change compared to last ECG Confirmed by Tereso Newcomer [phone]) on 04/23/2023 9:20:52 AM    Risk Assessment/Calculations:             Physical Exam:   VS:  BP 130/62   Pulse (!) 51   Ht 5\' 8"  (1.727 m)   Wt 175 lb 12.8 oz (79.7 kg)   SpO2 97%   BMI 26.73 kg/m    Wt Readings from Last 3 Encounters:  04/23/23 175 lb 12.8 oz (79.7 kg)  03/23/23 179 lb (81.2 kg)  02/16/23 176 lb (79.8 kg)    Constitutional:      Appearance: Healthy appearance. Not in distress.  Neck:     Vascular: No carotid bruit. JVD normal.  Pulmonary:     Breath sounds: Normal breath sounds. No wheezing. No rales.  Cardiovascular:     Bradycardia present. Regular rhythm.     Murmurs: There is no murmur.  Edema:    Peripheral edema  absent.  Abdominal:     Palpations: Abdomen is soft.      ASSESSMENT AND PLAN:   Bradycardia, sinus Asymptomatic.  No further testing or intervention needed at this time.  Aneurysm of ascending aorta without rupture (HCC) CT 05/2022: 4 cm.  Surveillance CTs are arranged by primary care.  We discussed the importance of maintaining normal blood pressure, avoiding fluoroquinolones and avoiding excessive straining.  Essential hypertension, benign Blood pressure is controlled on current medical therapy which includes irbesartan 75 mg daily.  Pure hypercholesterolemia Managed by primary care.  Goal LDL <70.  Coronary artery calcification seen on CT scan Minimal calcification on CT scan in June 2023 (0.814, 33rd percentile).  He is not having symptoms to suggest angina.  He remains on ASA, Lipitor.    Dispo:  Return in about 1 year (around 04/22/2024) for Routine Follow Up with Dr. Clifton Curtis.  Signed, Tereso Newcomer, PA-C

## 2023-04-23 ENCOUNTER — Ambulatory Visit: Payer: BC Managed Care – PPO | Attending: Physician Assistant | Admitting: Physician Assistant

## 2023-04-23 ENCOUNTER — Encounter: Payer: Self-pay | Admitting: Physician Assistant

## 2023-04-23 VITALS — BP 130/62 | HR 51 | Ht 68.0 in | Wt 175.8 lb

## 2023-04-23 DIAGNOSIS — I10 Essential (primary) hypertension: Secondary | ICD-10-CM | POA: Diagnosis not present

## 2023-04-23 DIAGNOSIS — R001 Bradycardia, unspecified: Secondary | ICD-10-CM

## 2023-04-23 DIAGNOSIS — I2510 Atherosclerotic heart disease of native coronary artery without angina pectoris: Secondary | ICD-10-CM | POA: Insufficient documentation

## 2023-04-23 DIAGNOSIS — E7800 Pure hypercholesterolemia, unspecified: Secondary | ICD-10-CM

## 2023-04-23 DIAGNOSIS — I7121 Aneurysm of the ascending aorta, without rupture: Secondary | ICD-10-CM | POA: Diagnosis not present

## 2023-04-23 NOTE — Assessment & Plan Note (Signed)
Minimal calcification on CT scan in June 2023 (0.814, 33rd percentile).  He is not having symptoms to suggest angina.  He remains on ASA, Lipitor.

## 2023-04-23 NOTE — Assessment & Plan Note (Signed)
Asymptomatic.  No further testing or intervention needed at this time.

## 2023-04-23 NOTE — Assessment & Plan Note (Signed)
CT 05/2022: 4 cm.  Surveillance CTs are arranged by primary care.  We discussed the importance of maintaining normal blood pressure, avoiding fluoroquinolones and avoiding excessive straining.

## 2023-04-23 NOTE — Assessment & Plan Note (Signed)
Managed by primary care.  Goal LDL <70.

## 2023-04-23 NOTE — Assessment & Plan Note (Signed)
Blood pressure is controlled on current medical therapy which includes irbesartan 75 mg daily.

## 2023-04-23 NOTE — Patient Instructions (Signed)
Medication Instructions:  Your physician recommends that you continue on your current medications as directed. Please refer to the Current Medication list given to you today.  *If you need a refill on your cardiac medications before your next appointment, please call your pharmacy*   Lab Work: None ordered  If you have labs (blood work) drawn today and your tests are completely normal, you will receive your results only by: MyChart Message (if you have MyChart) OR A paper copy in the mail If you have any lab test that is abnormal or we need to change your treatment, we will call you to review the results.   Testing/Procedures: None ordered   Follow-Up: At [facility], you and your health needs are our priority.  As part of our continuing mission to provide you with exceptional heart care, we have created designated Provider Care Teams.  These Care Teams include your primary Cardiologist (physician) and Advanced Practice Providers (APPs -  Physician Assistants and Nurse Practitioners) who all work together to provide you with the care you need, when you need it.  We recommend signing up for the patient portal called "MyChart".  Sign up information is provided on this After Visit Summary.  MyChart is used to connect with patients for Virtual Visits (Telemedicine).  Patients are able to view lab/test results, encounter notes, upcoming appointments, etc.  Non-urgent messages can be sent to your provider as well.   To learn more about what you can do with MyChart, go to [email or web address].    Your next appointment:   1 year(s)  Provider:   Christopher McAlhany, MD     Other Instructions   

## 2023-04-26 ENCOUNTER — Other Ambulatory Visit: Payer: Self-pay

## 2023-04-26 ENCOUNTER — Encounter: Payer: Self-pay | Admitting: Gastroenterology

## 2023-04-26 ENCOUNTER — Ambulatory Visit: Payer: BC Managed Care – PPO

## 2023-04-26 VITALS — Ht 68.0 in | Wt 175.0 lb

## 2023-04-26 DIAGNOSIS — K529 Noninfective gastroenteritis and colitis, unspecified: Secondary | ICD-10-CM

## 2023-04-26 NOTE — Progress Notes (Signed)
 Denies allergies to eggs or soy products. Denies complication of anesthesia or sedation. Denies use of weight loss medication. Denies use of O2.   Emmi instructions given for colonoscopy.  

## 2023-05-13 ENCOUNTER — Encounter: Payer: Self-pay | Admitting: Gastroenterology

## 2023-05-26 ENCOUNTER — Ambulatory Visit (AMBULATORY_SURGERY_CENTER): Payer: BC Managed Care – PPO | Admitting: Gastroenterology

## 2023-05-26 ENCOUNTER — Encounter: Payer: Self-pay | Admitting: Gastroenterology

## 2023-05-26 VITALS — BP 101/70 | HR 55 | Temp 98.3°F | Resp 7 | Ht 68.0 in | Wt 175.0 lb

## 2023-05-26 DIAGNOSIS — K2100 Gastro-esophageal reflux disease with esophagitis, without bleeding: Secondary | ICD-10-CM | POA: Diagnosis not present

## 2023-05-26 DIAGNOSIS — Z8379 Family history of other diseases of the digestive system: Secondary | ICD-10-CM

## 2023-05-26 DIAGNOSIS — Z800 Family history of malignant neoplasm of digestive organs: Secondary | ICD-10-CM | POA: Diagnosis not present

## 2023-05-26 DIAGNOSIS — Z1211 Encounter for screening for malignant neoplasm of colon: Secondary | ICD-10-CM

## 2023-05-26 DIAGNOSIS — R197 Diarrhea, unspecified: Secondary | ICD-10-CM | POA: Diagnosis not present

## 2023-05-26 DIAGNOSIS — K9041 Non-celiac gluten sensitivity: Secondary | ICD-10-CM

## 2023-05-26 DIAGNOSIS — K2950 Unspecified chronic gastritis without bleeding: Secondary | ICD-10-CM | POA: Diagnosis not present

## 2023-05-26 DIAGNOSIS — K3189 Other diseases of stomach and duodenum: Secondary | ICD-10-CM | POA: Diagnosis not present

## 2023-05-26 DIAGNOSIS — K5150 Left sided colitis without complications: Secondary | ICD-10-CM | POA: Diagnosis not present

## 2023-05-26 DIAGNOSIS — K529 Noninfective gastroenteritis and colitis, unspecified: Secondary | ICD-10-CM | POA: Diagnosis not present

## 2023-05-26 MED ORDER — SODIUM CHLORIDE 0.9 % IV SOLN
500.0000 mL | Freq: Once | INTRAVENOUS | Status: DC
Start: 2023-05-26 — End: 2023-05-26

## 2023-05-26 MED ORDER — ESOMEPRAZOLE MAGNESIUM 20 MG PO CPDR
DELAYED_RELEASE_CAPSULE | ORAL | 3 refills | Status: DC
Start: 2023-05-26 — End: 2023-08-18

## 2023-05-26 NOTE — Op Note (Signed)
[facility] Patient Name: Jaime Curtis Procedure Date: 05/26/2023 1:31 PM MRN: [id number] Endoscopist: Corliss Parish , MD, [id number] Age: 62 Referring MD:  Date of Birth: [birth year] Gender: Male Account #: [id number] Procedure:                Upper GI endoscopy Indications:              Exclusion of celiac disease, Diarrhea, Family                            History of Celiac Disease first degree relative Medicines:                Monitored Anesthesia Care Procedure:                Pre-Anesthesia Assessment:                           - Prior to the procedure, a History and Physical                            was performed, and patient medications and                            allergies were reviewed. The patient's tolerance of                            previous anesthesia was also reviewed. The risks                            and benefits of the procedure and the sedation                            options and risks were discussed with the patient.                            All questions were answered, and informed consent                            was obtained. Prior Anticoagulants: The patient has                            taken no anticoagulant or antiplatelet agents                            except for aspirin. ASA Grade Assessment: II - A                            patient with mild systemic disease. After reviewing                            the risks and benefits, the patient was deemed in                            satisfactory condition to undergo the procedure.  After obtaining informed consent, the endoscope was                            passed under direct vision. Throughout the                            procedure, the patient's blood pressure, pulse, and                            oxygen saturations were monitored continuously. The                            Olympus Scope [id number] was introduced through the                             mouth, and advanced to the second part of duodenum.                            The upper GI endoscopy was accomplished without                            difficulty. The patient tolerated the procedure. Scope In: Scope Out: Findings:                 No gross lesions were noted in the majority of the                            esophagus.                           LA Grade A (one or more mucosal breaks less than 5                            mm, not extending between tops of 2 mucosal folds)                            esophagitis with no bleeding was found in the very                            distal esophagus.                           The Z-line was irregular and was found 41 cm from                            the incisors.                           A 1 cm hiatal hernia was present.                           Patchy mildly erythematous mucosa without bleeding                            was  found in the stomach. Biopsies were taken with                            a cold forceps for histology and Helicobacter                            pylori testing.                           No gross lesions were noted in the duodenal bulb,                            in the first portion of the duodenum and in the                            second portion of the duodenum. Biopsies were taken                            with a cold forceps for histology. Complications:            No immediate complications. Estimated Blood Loss:     Estimated blood loss was minimal. Impression:               - No gross lesions in the majority of the                            esophagus..                           - LA Grade A esophagitis with no bleeding found in                            very distal esophagus.                           - Z-line irregular, 41 cm from the incisors.                           - 1 cm hiatal hernia.                           - Erythematous mucosa in the stomach. Biopsied.                            - No gross lesions in the duodenal bulb, in the                            first portion of the duodenum and in the second                            portion of the duodenum. Biopsied. Recommendation:           - Proceed to scheduled colonoscopy.                           -  Start Nexium 20 mg twice daily for 1 month then                            20 mg once daily.                           - Observe patient's clinical course.                           - Await pathology results.                           - Repeat endoscopy not necessarily required with                            grade a esophagitis if he is not having significant                            symptoms after initiation of treatment. Could be                            considered if desired.                           - The findings and recommendations were discussed                            with the patient.                           - The findings and recommendations were discussed                            with the patient's family. Corliss Parish, MD 05/26/2023 3:08:24 PM

## 2023-05-26 NOTE — Progress Notes (Unsigned)
VS completed by CW.   Pt's states no medical or surgical changes since previsit or office visit.  

## 2023-05-26 NOTE — Progress Notes (Unsigned)
Called to room to assist during endoscopic procedure.  Patient ID and intended procedure confirmed with present staff. Received instructions for my participation in the procedure from the performing physician.  

## 2023-05-26 NOTE — Progress Notes (Unsigned)
Sedate, gd SR, tolerated procedure well, VSS, report to RN 

## 2023-05-26 NOTE — Progress Notes (Unsigned)
GASTROENTEROLOGY PROCEDURE H&P NOTE   Primary Care Physician: Jaime Levins, MD  HPI: Jaime Curtis is a 62 y.o. male who presents for EGD/Colonoscopy for history of chronic diarrhea and family history of colon cancer and family history of celiac disease and gluten intolerance (negative Celiac serologies though at times has been following GFD).  Past Medical History:  Diagnosis Date   ALLERGIC RHINITIS 12/07/2007   Allergy    Arthritis    Bradycardia, sinus 07/10/2011   CHEST PAIN 12/07/2007   DARK URINE 12/07/2007   Family history of colon cancer 08/11/2013   mother, age 60   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   HEMATOCHEZIA 10/03/2007   High blood pressure    HYPERLIPIDEMIA 12/07/2007   Hypertension 12/12/2016   NECK PAIN 11/18/2010   Pure hypercholesterolemia 08/18/2007   SINUSITIS- ACUTE-NOS 11/25/2009   URI 11/18/2010   Past Surgical History:  Procedure Laterality Date   [employer] bone anchored hearing aide Right    COLONOSCOPY     6-8 years ago   HAND SURGERY Right    HERNIA MESH REMOVAL     hx of fractured right scapula     inguinal herniorrhapy Bilateral    left   TONSILLECTOMY     Current Outpatient Medications  Medication Sig Dispense Refill   aspirin 81 MG tablet Take 81 mg by mouth daily.     atorvastatin (LIPITOR) 40 MG tablet Take 1 tablet (40 mg total) by mouth daily. 90 tablet 3   irbesartan (AVAPRO) 75 MG tablet TAKE 1 TABLET DAILY 90 tablet 3   Current Facility-Administered Medications  Medication Dose Route Frequency Provider Last Rate Last Admin   0.9 %  sodium chloride infusion  500 mL Intravenous Once Mansouraty, Netty Starring., MD        Current Outpatient Medications:    aspirin 81 MG tablet, Take 81 mg by mouth daily., Disp: , Rfl:    atorvastatin (LIPITOR) 40 MG tablet, Take 1 tablet (40 mg total) by mouth daily., Disp: 90 tablet, Rfl: 3   irbesartan (AVAPRO) 75 MG tablet, TAKE 1 TABLET DAILY, Disp:  90 tablet, Rfl: 3  Current Facility-Administered Medications:    0.9 %  sodium chloride infusion, 500 mL, Intravenous, Once, Mansouraty, Netty Starring., MD Allergies  Allergen Reactions   Pseudoephedrine Other (See Comments)   Family History  Problem Relation Age of Onset   Colon cancer Mother 60       doing well s/p colon surgery   Sudden death Father    Diabetes Sister    Crohn's disease Sister    Heart attack Sister    Cancer Maternal Grandmother        type unknown   Rectal cancer Neg Hx    Stomach cancer Neg Hx    Liver disease Neg Hx    Esophageal cancer Neg Hx    Inflammatory bowel disease Neg Hx    Pancreatic cancer Neg Hx    Social History   Socioeconomic History   Marital status: Married    Spouse name: Not on file   Number of children: 3   Years of education: Not on file   Highest education level: Not on file  Occupational History   Occupation: Field seismologist  Tobacco Use   Smoking status: Never   Smokeless tobacco: Never  Vaping Use   Vaping status: Never Used  Substance and Sexual Activity   Alcohol use: Yes  Alcohol/week: 0.0 standard drinks of alcohol    Comment: socially   Drug use: No   Sexual activity: Not on file  Other Topics Concern   Not on file  Social History Narrative   Not on file   Social Determinants of Health   Financial Resource Strain: Not on file  Food Insecurity: Not on file  Transportation Needs: Not on file  Physical Activity: Not on file  Stress: Not on file  Social Connections: Not on file  Intimate Partner Violence: Not on file    Physical Exam: Today's Vitals   05/26/23 1256  BP: (!) 112/57  Pulse: (!) 43  Temp: 98.3 F (36.8 C)  TempSrc: Temporal  SpO2: 99%  Weight: 175 lb (79.4 kg)  Height: 5\' 8"  (1.727 m)   Body mass index is 26.61 kg/m. GEN: NAD EYE: Sclerae anicteric ENT: MMM CV: Non-tachycardic GI: Soft, NT/ND NEURO:  Alert & Oriented x 3  Lab Results: No results for input(s): "WBC", "HGB",  "HCT", "PLT" in the last 72 hours. BMET No results for input(s): "NA", "K", "CL", "CO2", "GLUCOSE", "BUN", "CREATININE", "CALCIUM" in the last 72 hours. LFT No results for input(s): "PROT", "ALBUMIN", "AST", "ALT", "ALKPHOS", "BILITOT", "BILIDIR", "IBILI" in the last 72 hours. PT/INR No results for input(s): "LABPROT", "INR" in the last 72 hours.   Impression / Plan: This is a 62 y.o.male who presents for EGD/Colonoscopy for history of chronic diarrhea and family history of colon cancer and family history of celiac disease and gluten intolerance (negative Celiac serologies though at times has been following GFD).  The risks and benefits of endoscopic evaluation/treatment were discussed with the patient and/or family; these include but are not limited to the risk of perforation, infection, bleeding, missed lesions, lack of diagnosis, severe illness requiring hospitalization, as well as anesthesia and sedation related illnesses.  The patient's history has been reviewed, patient examined, no change in status, and deemed stable for procedure.  The patient and/or family is agreeable to proceed.    Jaime Parish, MD [facility] Advanced Endoscopy Office # [phone]

## 2023-05-26 NOTE — Op Note (Signed)
[facility] Patient Name: Jaime Curtis Procedure Date: 05/26/2023 1:30 PM MRN: [id number] Endoscopist: Corliss Parish , MD, [id number] Age: 62 Referring MD:  Date of Birth: 06/17/1961 Gender: Male Account #: [id number] Procedure:                Colonoscopy Indications:              Colon cancer screening in patient at increased                            risk: Colorectal cancer in mother, Incidental                            diarrhea noted Medicines:                Monitored Anesthesia Care Procedure:                Pre-Anesthesia Assessment:                           - Prior to the procedure, a History and Physical                            was performed, and patient medications and                            allergies were reviewed. The patient's tolerance of                            previous anesthesia was also reviewed. The risks                            and benefits of the procedure and the sedation                            options and risks were discussed with the patient.                            All questions were answered, and informed consent                            was obtained. Prior Anticoagulants: The patient has                            taken no anticoagulant or antiplatelet agents                            except for aspirin. ASA Grade Assessment: II - A                            patient with mild systemic disease. After reviewing                            the risks and benefits, the patient was deemed in  satisfactory condition to undergo the procedure.                           After obtaining informed consent, the colonoscope                            was passed under direct vision. Throughout the                            procedure, the patient's blood pressure, pulse, and                            oxygen saturations were monitored continuously. The                            Olympus [id number]  [id number]) Colonoscope was                            introduced through the anus and advanced to the 3                            cm into the ileum. The colonoscopy was performed                            without difficulty. The patient tolerated the                            procedure. The quality of the bowel preparation was                            good. The terminal ileum, ileocecal valve,                            appendiceal orifice, and rectum were photographed. Scope In: 2:48:28 PM Scope Out: 3:01:36 PM Scope Withdrawal Time: 0 hours 10 minutes 14 seconds  Total Procedure Duration: 0 hours 13 minutes 8 seconds  Findings:                 The digital rectal exam findings include                            hemorrhoids. Pertinent negatives include no                            palpable rectal lesions.                           The terminal ileum and ileocecal valve appeared                            normal.                           Diffuse granular mucosa was found in the transverse  colon, at the hepatic flexure, in the ascending                            colon and in the cecum. Biopsies for histology were                            taken with a cold forceps for evaluation of                            microscopic colitis.                           Diffuse granular mucosa was found in the                            recto-sigmoid colon, in the sigmoid colon, in the                            descending colon and at the splenic flexure.                            Biopsies for histology were taken with a cold                            forceps for evaluation of microscopic colitis.                           Normal mucosa was found in the rectum. Biopsies                            were taken with a cold forceps for histology.                           Non-bleeding non-thrombosed external and internal                            hemorrhoids were found  during retroflexion, during                            perianal exam and during digital exam. The                            hemorrhoids were Grade II (internal hemorrhoids                            that prolapse but reduce spontaneously). Complications:            No immediate complications. Estimated Blood Loss:     Estimated blood loss was minimal. Impression:               - Hemorrhoids found on digital rectal exam.                           - The examined portion of the ileum was normal.                           -  Granularity in the left colon. Biopsied.                           - Granularity in the right colon. Biopsied.                           - Normal mucosa in the rectum. Biopsied.                           - Non-bleeding non-thrombosed external and internal                            hemorrhoids. Recommendation:           - The patient will be observed post-procedure,                            until all discharge criteria are met.                           - Discharge patient to home.                           - Patient has a contact number available for                            emergencies. The signs and symptoms of potential                            delayed complications were discussed with the                            patient. Return to normal activities tomorrow.                            Written discharge instructions were provided to the                            patient.                           - High fiber diet.                           - Continue present medications.                           - Await pathology results.                           - Repeat colonoscopy in 5 years for screening                            purposes due to family history otherwise sooner if                            evidence of chronic colitis  is noted..                           - The findings and recommendations were discussed                            with the patient.                            - The findings and recommendations were discussed                            with the patient's family. Corliss Parish, MD 05/26/2023 3:12:37 PM

## 2023-05-26 NOTE — Patient Instructions (Addendum)
Start Nexium 20 mg twice daily for 1 month then 20 mg once daily. Observe patient's clinical course. Await pathology results. Repeat endoscopy not necessarily required with grade a esophagitis if he is not having significant symptoms after initiation of treatment. Could be considered if desired. High fiber diet. Continue present medications. Repeat colonoscopy in 5 years for screening purposes due to family history otherwise sooner if  evidence of chronic colitis is noted.                                                                                                                           YOU HAD AN ENDOSCOPIC PROCEDURE TODAY AT THE  ENDOSCOPY CENTER:   Refer to the procedure report that was given to you for any specific questions about what was found during the examination.  If the procedure report does not answer your questions, please call your gastroenterologist to clarify.  If you requested that your care partner not be given the details of your procedure findings, then the procedure report has been included in a sealed envelope for you to review at your convenience later.  YOU SHOULD EXPECT: Some feelings of bloating in the abdomen. Passage of more gas than usual.  Walking can help get rid of the air that was put into your GI tract during the procedure and reduce the bloating. If you had a lower endoscopy (such as a colonoscopy or flexible sigmoidoscopy) you may notice spotting of blood in your stool or on the toilet paper. If you underwent a bowel prep for your procedure, you may not have a normal bowel movement for a few days.  Please Note:  You might notice some irritation and congestion in your nose or some drainage.  This is from the oxygen used during your procedure.  There is no need for concern and it should clear up in a day or so.  SYMPTOMS TO REPORT IMMEDIATELY:  Following lower endoscopy (colonoscopy or flexible sigmoidoscopy):  Excessive amounts of blood in the  stool  Significant tenderness or worsening of abdominal pains  Swelling of the abdomen that is new, acute  Fever of 100F or higher  Following upper endoscopy (EGD)  Vomiting of blood or coffee ground material  New chest pain or pain under the shoulder blades  Painful or persistently difficult swallowing  New shortness of breath  Fever of 100F or higher  Black, tarry-looking stools  For urgent or emergent issues, a gastroenterologist can be reached at any hour by calling [phone]. Do not use MyChart messaging for urgent concerns.    DIET:  We do recommend a small meal at first, but then you may proceed to your regular diet.  Drink plenty of fluids but you should avoid alcoholic beverages for 24 hours.  ACTIVITY:  You should plan to take it easy for the rest of today and you should NOT DRIVE or use heavy machinery until tomorrow (because of the sedation medicines used during the test).  FOLLOW UP: Our staff will call the number listed on your records the next business day following your procedure.  We will call around 7:15- 8:00 am to check on you and address any questions or concerns that you may have regarding the information given to you following your procedure. If we do not reach you, we will leave a message.     If any biopsies were taken you will be contacted by phone or by letter within the next 1-3 weeks.  Please call us at [phone] if you have not heard about the biopsies in 3 weeks.    SIGNATURES/CONFIDENTIALITY: You and/or your care partner have signed paperwork which will be entered into your electronic medical record.  These signatures attest to the fact that that the information above on your After Visit Summary has been reviewed and is understood.  Full responsibility of the confidentiality of this discharge information lies with you and/or your care-partner.

## 2023-05-27 ENCOUNTER — Telehealth: Payer: Self-pay | Admitting: *Deleted

## 2023-05-27 NOTE — Telephone Encounter (Signed)
  Follow up Call-     05/26/2023   12:57 PM  Call back number  Post procedure Call Back phone  # [phone]  Permission to leave phone message Yes     Patient questions:  Do you have a fever, pain , or abdominal swelling? No. Pain Score  0 *  Have you tolerated food without any problems? Yes.    Have you been able to return to your normal activities? Yes.    Do you have any questions about your discharge instructions: Diet   No. Medications  No. Follow up visit  No.  Do you have questions or concerns about your Care? No.  Actions: * If pain score is 4 or above: No action needed, pain <4.

## 2023-05-31 ENCOUNTER — Ambulatory Visit
Admission: RE | Admit: 2023-05-31 | Discharge: 2023-05-31 | Disposition: A | Discharge status: Home / Self Care | Payer: BC Managed Care – PPO | Source: Ambulatory Visit | Attending: Internal Medicine | Admitting: Internal Medicine

## 2023-05-31 DIAGNOSIS — J439 Emphysema, unspecified: Secondary | ICD-10-CM | POA: Diagnosis not present

## 2023-05-31 DIAGNOSIS — I7121 Aneurysm of the ascending aorta, without rupture: Secondary | ICD-10-CM

## 2023-05-31 MED ORDER — IOPAMIDOL (ISOVUE-370) INJECTION 76%
75.0000 mL | Freq: Once | INTRAVENOUS | Status: AC | PRN
  Administered 2023-05-31: 75 mL via INTRAVENOUS

## 2023-06-01 ENCOUNTER — Other Ambulatory Visit (HOSPITAL_COMMUNITY): Payer: Self-pay

## 2023-06-01 ENCOUNTER — Telehealth: Payer: Self-pay

## 2023-06-01 NOTE — Telephone Encounter (Signed)
Pharmacy Patient Advocate Encounter  Received notification from [facility] that Prior Authorization for Esomeprazole 20mg  has been APPROVED from 06/01/2023 to 05/30/2024

## 2023-06-01 NOTE — Telephone Encounter (Signed)
*  Gastro  Pharmacy Patient Advocate Encounter   Received notification from Fax that prior authorization for Esomeprazole Magnesium 20MG  dr capsules  is required/requested.   Insurance verification completed.   The patient is insured through [employer] .   Per test claim: PA required; PA submitted to [employer] via CoverMyMeds Key/confirmation #/[id number] Status is pending

## 2023-06-07 ENCOUNTER — Encounter: Payer: Self-pay | Admitting: Gastroenterology

## 2023-06-08 ENCOUNTER — Other Ambulatory Visit: Payer: Self-pay

## 2023-06-08 DIAGNOSIS — R933 Abnormal findings on diagnostic imaging of other parts of digestive tract: Secondary | ICD-10-CM

## 2023-06-08 DIAGNOSIS — Z8379 Family history of other diseases of the digestive system: Secondary | ICD-10-CM

## 2023-06-08 DIAGNOSIS — K529 Noninfective gastroenteritis and colitis, unspecified: Secondary | ICD-10-CM

## 2023-06-08 MED ORDER — MESALAMINE 1.2 G PO TBEC: 2.4000 g | DELAYED_RELEASE_TABLET | Freq: Two times a day (BID) | ORAL | 0 refills | Status: DC

## 2023-06-15 ENCOUNTER — Telehealth: Payer: Self-pay | Admitting: Gastroenterology

## 2023-06-15 NOTE — Telephone Encounter (Signed)
Inbound call from patient requesting a call back to discuss medication and lab work he is due for. Patient is concerned about mesalamine and esomeprazole medication will interfere with his blood pressure and cholesterol medication. Patient also requesting to know if it is necessary for his lab work to be completed due to his last lab work being done in June. Please advise, thank you.

## 2023-06-15 NOTE — Telephone Encounter (Signed)
The pt has been advised that he does need to have the labs that was recommended by Dr Meridee Score.  He will come in as he is able for those. He will also ask his questions regarding medication interference with his PCP and at his upcoming appt in Nov. The pt has been advised of the information and verbalized understanding.

## 2023-06-18 ENCOUNTER — Other Ambulatory Visit (INDEPENDENT_AMBULATORY_CARE_PROVIDER_SITE_OTHER): Payer: BC Managed Care – PPO

## 2023-06-18 ENCOUNTER — Encounter: Payer: Self-pay | Admitting: Internal Medicine

## 2023-06-18 DIAGNOSIS — Z8379 Family history of other diseases of the digestive system: Secondary | ICD-10-CM

## 2023-06-18 DIAGNOSIS — R933 Abnormal findings on diagnostic imaging of other parts of digestive tract: Secondary | ICD-10-CM

## 2023-06-18 DIAGNOSIS — K529 Noninfective gastroenteritis and colitis, unspecified: Secondary | ICD-10-CM | POA: Diagnosis not present

## 2023-06-18 LAB — CBC WITH DIFFERENTIAL/PLATELET
Basophils Absolute: 0.1 10*3/uL (ref 0.0–0.1)
Basophils Relative: 1.4 % (ref 0.0–3.0)
Eosinophils Absolute: 0.2 10*3/uL (ref 0.0–0.7)
Eosinophils Relative: 3.7 % (ref 0.0–5.0)
HCT: 45.2 % (ref 39.0–52.0)
Hemoglobin: 15 g/dL (ref 13.0–17.0)
Lymphocytes Relative: 14.2 % (ref 12.0–46.0)
Lymphs Abs: 0.9 10*3/uL (ref 0.7–4.0)
MCHC: 33.1 g/dL (ref 30.0–36.0)
MCV: 93.4 fl (ref 78.0–100.0)
Monocytes Absolute: 0.5 10*3/uL (ref 0.1–1.0)
Monocytes Relative: 8.2 % (ref 3.0–12.0)
Neutro Abs: 4.6 10*3/uL (ref 1.4–7.7)
Neutrophils Relative %: 72.5 % (ref 43.0–77.0)
Platelets: 229 10*3/uL (ref 150.0–400.0)
RBC: 4.84 Mil/uL (ref 4.22–5.81)
RDW: 13.9 % (ref 11.5–15.5)
WBC: 6.4 10*3/uL (ref 4.0–10.5)

## 2023-06-18 LAB — COMPREHENSIVE METABOLIC PANEL
ALT: 20 U/L (ref 0–53)
AST: 18 U/L (ref 0–37)
Albumin: 3.8 g/dL (ref 3.5–5.2)
Alkaline Phosphatase: 55 U/L (ref 39–117)
BUN: 20 mg/dL (ref 6–23)
CO2: 27 meq/L (ref 19–32)
Calcium: 8.9 mg/dL (ref 8.4–10.5)
Chloride: 110 meq/L (ref 96–112)
Creatinine, Ser: 0.98 mg/dL (ref 0.40–1.50)
GFR: 83.51 mL/min (ref >60.00)
Glucose, Bld: 88 mg/dL (ref 70–99)
Potassium: 4.5 meq/L (ref 3.5–5.1)
Sodium: 144 meq/L (ref 135–145)
Total Bilirubin: 0.5 mg/dL (ref 0.2–1.2)
Total Protein: 5.7 g/dL — ABNORMAL LOW (ref 6.0–8.3)

## 2023-07-16 ENCOUNTER — Ambulatory Visit: Payer: BC Managed Care – PPO

## 2023-07-16 DIAGNOSIS — R933 Abnormal findings on diagnostic imaging of other parts of digestive tract: Secondary | ICD-10-CM

## 2023-07-16 DIAGNOSIS — Z8379 Family history of other diseases of the digestive system: Secondary | ICD-10-CM

## 2023-07-16 DIAGNOSIS — K529 Noninfective gastroenteritis and colitis, unspecified: Secondary | ICD-10-CM | POA: Diagnosis not present

## 2023-07-21 ENCOUNTER — Other Ambulatory Visit: Payer: Self-pay

## 2023-07-21 DIAGNOSIS — K529 Noninfective gastroenteritis and colitis, unspecified: Secondary | ICD-10-CM

## 2023-07-21 LAB — CALPROTECTIN, FECAL: Calprotectin, Fecal: 123 ug/g — ABNORMAL HIGH (ref 0–120)

## 2023-08-18 ENCOUNTER — Ambulatory Visit: Payer: BC Managed Care – PPO | Admitting: Gastroenterology

## 2023-08-18 ENCOUNTER — Encounter: Payer: Self-pay | Admitting: Gastroenterology

## 2023-08-18 ENCOUNTER — Other Ambulatory Visit (INDEPENDENT_AMBULATORY_CARE_PROVIDER_SITE_OTHER): Payer: BC Managed Care – PPO

## 2023-08-18 VITALS — BP 102/62 | HR 69 | Ht 68.0 in | Wt 174.1 lb

## 2023-08-18 DIAGNOSIS — K529 Noninfective gastroenteritis and colitis, unspecified: Secondary | ICD-10-CM

## 2023-08-18 DIAGNOSIS — R933 Abnormal findings on diagnostic imaging of other parts of digestive tract: Secondary | ICD-10-CM

## 2023-08-18 DIAGNOSIS — Z8379 Family history of other diseases of the digestive system: Secondary | ICD-10-CM

## 2023-08-18 DIAGNOSIS — K9041 Non-celiac gluten sensitivity: Secondary | ICD-10-CM | POA: Diagnosis not present

## 2023-08-18 LAB — COMPREHENSIVE METABOLIC PANEL
ALT: 20 U/L (ref 0–53)
AST: 18 U/L (ref 0–37)
Albumin: 3.9 g/dL (ref 3.5–5.2)
Alkaline Phosphatase: 63 U/L (ref 39–117)
BUN: 16 mg/dL (ref 6–23)
CO2: 26 meq/L (ref 19–32)
Calcium: 8.9 mg/dL (ref 8.4–10.5)
Chloride: 104 meq/L (ref 96–112)
Creatinine, Ser: 1.1 mg/dL (ref 0.40–1.50)
GFR: 72.62 mL/min (ref >60.00)
Glucose, Bld: 92 mg/dL (ref 70–99)
Potassium: 4.1 meq/L (ref 3.5–5.1)
Sodium: 136 meq/L (ref 135–145)
Total Bilirubin: 0.6 mg/dL (ref 0.2–1.2)
Total Protein: 6.4 g/dL (ref 6.0–8.3)

## 2023-08-18 LAB — CBC
HCT: 47.1 % (ref 39.0–52.0)
Hemoglobin: 15.6 g/dL (ref 13.0–17.0)
MCHC: 33 g/dL (ref 30.0–36.0)
MCV: 91.9 fL (ref 78.0–100.0)
Platelets: 273 10*3/uL (ref 150.0–400.0)
RBC: 5.12 Mil/uL (ref 4.22–5.81)
RDW: 13.6 % (ref 11.5–15.5)
WBC: 7.1 10*3/uL (ref 4.0–10.5)

## 2023-08-18 MED ORDER — PREDNISONE 10 MG PO TABS: ORAL_TABLET | ORAL | 0 refills | Status: AC

## 2023-08-18 NOTE — Progress Notes (Signed)
GASTROENTEROLOGY OUTPATIENT CLINIC VISIT   Primary Care Provider Corwin Levins, MD [address] [place] [address] [phone]  Patient Profile: Jaime Curtis is a 62 y.o. male with a pmh significant for hypertension, hyperlipidemia, nephrolithiasis, family history colon cancer (mother), chronic diarrhea (colonoscopy with some findings of chronic colitis) versus IBS-D.  The patient presents to the [facility] for an evaluation and management of problem(s) noted below:  Problem List 1. Chronic diarrhea   2. Chronic colitis   3. Abnormal colonoscopy   4. Gluten intolerance    Discussed the use of AI scribe software for clinical note transcription with the patient, who gave verbal consent to proceed.  History of Present Illness Please see prior GI notes for full details of HPI.  Interval History The patient returns for follow-up.  He is having persistent and potentially worsening gastrointestinal symptoms. Despite a two-month course of mesalamine therapy (4.8 g), the patient reports no significant improvement in symptoms. The patient describes an increase in bowel movements for the last couple of weeks now up to six to eight times daily, primarily liquid (Bristol scale 6) in consistency. No blood has been observed in the stool, but the patient is unsure about the presence of mucus. The patient also reports nocturnal bowel movements, causing sleep disruption at times.  We discussed that as per his colonoscopy results, there was acute inflammation on the right side of the colon and mild chronic changes on the left side with normal rectal biopsies.  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  The patient denies any recent exposure to infectious diseases or similar symptoms in close contacts. The patient has not taken any anti-diarrheal medications.   GI Review of  Systems Positive as above Negative for dysphagia, odynophagia, nausea, vomiting, pain, melena, hematochezia  Review of Systems General: Denies fevers/chills/weight loss unintentionally Cardiovascular: Denies chest pain Pulmonary: Denies shortness of breath Gastroenterological: See HPI Genitourinary: Denies darkened urine Hematological: Denies easy bruising/bleeding Dermatological: Denies jaundice Psychological: Mood is stable   Medications Current Outpatient Medications  Medication Sig Dispense Refill   aspirin 81 MG tablet Take 81 mg by mouth daily.     atorvastatin (LIPITOR) 40 MG tablet Take 1 tablet (40 mg total) by mouth daily. 90 tablet 3   esomeprazole (NEXIUM) 20 MG capsule Take 20 mg by mouth daily at 12 noon.     irbesartan (AVAPRO) 75 MG tablet TAKE 1 TABLET DAILY 90 tablet 3   mesalamine (LIALDA) 1.2 g EC tablet Take 2 tablets (2.4 g total) by mouth in the morning and at bedtime. 360 tablet 0   predniSONE (DELTASONE) 10 MG tablet Take 4 tablets (40 mg total) by mouth daily with breakfast for 7 days, THEN 3 tablets (30 mg total) daily with breakfast for 7 days, THEN 2 tablets (20 mg total) daily with breakfast for 7 days, THEN 1 tablet (10 mg total) daily with breakfast for 7 days. 70 tablet 0   No current facility-administered medications for this visit.    Allergies Allergies  Allergen Reactions   Pseudoephedrine Other (See Comments)    Histories Past Medical History:  Diagnosis Date   ALLERGIC RHINITIS 12/07/2007   Allergy    Arthritis    Bradycardia, sinus 07/10/2011   CHEST PAIN 12/07/2007   DARK URINE 12/07/2007   Family history of colon cancer 08/11/2013   mother, age 12   Hearing loss in  right ear    [employer] bone anchored hearing aide right scalp   HEMATOCHEZIA 10/03/2007   High blood pressure    HYPERLIPIDEMIA 12/07/2007   Hypertension 12/12/2016   NECK PAIN 11/18/2010   Pure hypercholesterolemia 08/18/2007   SINUSITIS- ACUTE-NOS 11/25/2009   URI  11/18/2010   Past Surgical History:  Procedure Laterality Date   b[employer] bone anchored hearing aide Right    COLONOSCOPY     6-8 years ago   HAND SURGERY Right    HERNIA MESH REMOVAL     hx of fractured right scapula     inguinal herniorrhapy Bilateral    left   TONSILLECTOMY     Social History   Socioeconomic History   Marital status: Married    Spouse name: Not on file   Number of children: 3   Years of education: Not on file   Highest education level: Not on file  Occupational History   Occupation: Field seismologist  Tobacco Use   Smoking status: Never   Smokeless tobacco: Never  Vaping Use   Vaping status: Never Used  Substance and Sexual Activity   Alcohol use: Yes    Alcohol/week: 0.0 standard drinks of alcohol    Comment: socially   Drug use: No   Sexual activity: Not on file  Other Topics Concern   Not on file  Social History Narrative   Not on file   Social Determinants of Health   Financial Resource Strain: Not on file  Food Insecurity: Not on file  Transportation Needs: Not on file  Physical Activity: Not on file  Stress: Not on file  Social Connections: Not on file  Intimate Partner Violence: Not on file   Family History  Problem Relation Age of Onset   Colon cancer Mother 59       doing well s/p colon surgery   Sudden death Father    Diabetes Sister    Crohn's disease Sister    Heart attack Sister    Cancer Maternal Grandmother        type unknown   Rectal cancer Neg Hx    Stomach cancer Neg Hx    Liver disease Neg Hx    Esophageal cancer Neg Hx    Inflammatory bowel disease Neg Hx    Pancreatic cancer Neg Hx    I have reviewed his medical, social, and family history in detail and updated the electronic medical record as necessary.    PHYSICAL EXAMINATION  BP 102/62   Pulse 69   Ht 5\' 8"  (1.727 m)   Wt 174 lb 1 oz (79 kg)   SpO2 96%   BMI 26.47 kg/m  Wt Readings from Last 3 Encounters:  08/18/23 174 lb 1 oz (79 kg)  05/26/23 175  lb (79.4 kg)  04/26/23 175 lb (79.4 kg)  GEN: NAD, appears stated age, doesn't appear chronically ill PSYCH: Cooperative, without pressured speech EYE: Conjunctivae pink, sclerae anicteric ENT: MMM CV: Nontachycardic RESP: No audible wheezing GI: NABS, soft, NT/ND, without rebound MSK/EXT: No significant lower extremity edema SKIN: No jaundice NEURO:  Alert & Oriented x 3, no focal deficits   REVIEW OF DATA  I reviewed the following data at the time of this encounter:  GI Procedures and Studies  August 2024 EGD - No gross lesions in the majority of the esophagus.. - LA Grade A esophagitis with no bleeding found in very distal esophagus. - Z-line irregular, 41 cm from the incisors. - 1 cm hiatal hernia. -  Erythematous mucosa in the stomach. Biopsied. - No gross lesions in the duodenal bulb, in the first portion of the duodenum and in the second portion of the duodenum. Biopsied.  August 2024 colonoscopy - Hemorrhoids found on digital rectal exam. - The examined portion of the ileum was normal. - Granularity in the left colon. Biopsied. - Granularity in the right colon. Biopsied. - Normal mucosa in the rectum. Biopsied. - Non-bleeding non-thrombosed external and internal hemorrhoids.  Pathology Diagnosis 1. Surgical [P], random gastric sites - ANTRAL AND OXYNTIC MUCOSA WITH MILD CHRONIC INFLAMMATION, CONGESTION AND MILD REACTIVE (CHEMICAL) GASTROPATHY - IMMUNOHISTOCHEMICAL STAIN FOR HELICOBACTER ORGANISMS IS NEGATIVE 2. Surgical [P], duodenum - BENIGN DUODENAL MUCOSA WITH FOCAL, PATCHY MILDLY INCREASED INTRAEPITHELIAL LYMPHOCYTES - NEGATIVE FOR SIGNIFICANT ACTIVE INFLAMMATION - SEE NOTE 3. Surgical [P], right colon - COLONIC MUCOSA WITH LYMPHOID AGGREGATES AND MILD ACTIVE COLITIS - NEGATIVE FOR DEFINITIVE FEATURES OF CHRONICITY, GRANULOMATA, DYSPLASIA OR MALIGNANCY - SEE NOTE 4. Surgical [P], left colon - COLONIC MUCOSA WITH MILD ACTIVE, FOCAL CHRONIC COLITIS - NEGATIVE FOR  GRANULOMATA, DYSPLASIA OR MALIGNANCY - SEE NOTE 5. Surgical [P], colon, rectum - COLONIC MUCOSA WITH FOCAL LYMPHOID AGGREGATES - NEGATIVE FOR DEFINITIVE FEATURES OF CHRONICITY, GRANULOMATA, DYSPLASIA OR MALIGNANCY Diagnosis Note 2. These findings are non-specific. Possible etiologies include immune-mediated injuries, infections, medications and peptic disease. Clinical correlation with appropriate serologic studies should be considered; if clinically indicated. 3. The histologic features are non-specific and can be seen in low grade ischemia, medication effect, self-limited infectious processes and inflammatory bowel disease. Clinical-pathologic correlation is recommended. 4. The histologic features are non-specific and can be seen in low grade ischemia, medication effect, self-limited infectious processes, inflammatory bowel disease and biopsy changes adjacent to diverticular disease. Clinical-pathologic correlation is recommended.  This  Laboratory Studies  Reviewed those in epic  Imaging Studies  No relevant studies to review   ASSESSMENT  Mr. Mulderig is a 62 y.o. male with a pmh significant for hypertension, hyperlipidemia, nephrolithiasis, family history colon cancer (mother), chronic diarrhea versus IBS-D.  The patient is seen today for evaluation and management of:  1. Chronic diarrhea   2. Chronic colitis   3. Abnormal colonoscopy   4. Gluten intolerance    Although the patient remains hemodynamically stable, the last few weeks clinically has not been as well.  With the findings of his colonoscopy suggesting chronic changes within the left colon, his trial of mesalamine therapy does not seem to have been helpful for him over the last few weeks and he still has an elevated fecal calprotectin.  At this point, I am beginning to become more concerned in regards to the possibility of true underlying inflammatory bowel disease.  This does not have the clinical picture of overt Crohn's  disease but more of an indeterminate colitis.  There is a possibility that he could require some form of biologic therapy in the future and we will get laboratories to ensure we are ready in case we need to consider this further.  We are going to give him a steroid taper to see if this is clinically helpful for his symptoms or not.  Will also get a C. Difficile to make sure he does not have C. difficile based on the last 2 weeks having exacerbation of symptoms.  All patient questions were answered to the best of my ability, and the patient agrees to the aforementioned plan of action with follow-up as indicated.  PLAN  Laboratories as outlined below Stool studies as outlined below Prednisone  taper - 40 mg x 7 days - 30 mg x 7 days - 20 mg x 7 days - 10 mg x 7 days Consider SIBO breath testing prior to biologic therapy Continue GFD for gluten intolerance Minimize any dairy products/lactose products Consideration of IBS/D Xifaxan therapy Consideration of biologic therapy will need to be had if all above fails to help symptoms   Orders Placed This Encounter  Procedures   Clostridium difficile Toxin B, Qualitative, Real-Time PCR   Calprotectin, Fecal   CBC   Comp Met (CMET)   Endomysial IgA Antibody   Hepatitis A Antibody, Total   Hepatitis B Surface AntiBODY   Hepatitis B Surface AntiGEN   Hepatitis B core antibody, total   Hepatitis C Antibody   HIV antibody (with reflex)   QuantiFERON-TB Gold Plus   Thiopurine methyltransferase(tpmt)rbc    New Prescriptions   PREDNISONE (DELTASONE) 10 MG TABLET    Take 4 tablets (40 mg total) by mouth daily with breakfast for 7 days, THEN 3 tablets (30 mg total) daily with breakfast for 7 days, THEN 2 tablets (20 mg total) daily with breakfast for 7 days, THEN 1 tablet (10 mg total) daily with breakfast for 7 days.   Modified Medications   No medications on file    Planned Follow Up No follow-ups on file.   Total Time in Face-to-Face and in  Coordination of Care for patient including independent/personal interpretation/review of prior testing, medical history, examination, medication adjustment, communicating results with the patient directly, and documentation within the EHR is 30 minutes.   Corliss Parish, MD [facility] Office # [phone]

## 2023-08-18 NOTE — Patient Instructions (Signed)
Your provider has requested that you go to the basement level for lab work before leaving today. Press "B" on the elevator. The lab is located at the first door on the left as you exit the elevator.  We have sent the following medications to your pharmacy for you to pick up at your convenience: Prednisone   Fecal Calprotectin will need to be done 3 weeks after being on Prednisone.   Due to recent changes in healthcare laws, you may see the results of your imaging and laboratory studies on MyChart before your provider has had a chance to review them.  We understand that in some cases there may be results that are confusing or concerning to you. Not all laboratory results come back in the same time frame and the provider may be waiting for multiple results in order to interpret others.  Please give [place] 48 hours in order for your provider to thoroughly review all the results before contacting the office for clarification of your results.   _______________________________________________________  If your blood pressure at your visit was 140/90 or greater, please contact your primary care physician to follow up on this.  _______________________________________________________  If you are age 61 or older, your body mass index should be between 23-30. Your Body mass index is 26.47 kg/m. If this is out of the aforementioned range listed, please consider follow up with your Primary Care Provider.  If you are age 90 or younger, your body mass index should be between 19-25. Your Body mass index is 26.47 kg/m. If this is out of the aformentioned range listed, please consider follow up with your Primary Care Provider.   ________________________________________________________  The [facility] providers would like to encourage you to use [facility] to communicate with providers for non-urgent requests or questions.  Due to long hold times on the telephone, sending your provider a message by [facility] may be a  faster and more efficient way to get a response.  Please allow 48 business hours for a response.  Please remember that this is for non-urgent requests.  _______________________________________________________  Thank you for choosing me and [facility].  Dr. Meridee Score

## 2023-08-18 NOTE — Addendum Note (Signed)
Addended by: Clearnce Sorrel on: 08/18/2023 01:45 PM   Modules accepted: Orders

## 2023-08-19 ENCOUNTER — Ambulatory Visit: Payer: BC Managed Care – PPO

## 2023-08-19 DIAGNOSIS — K529 Noninfective gastroenteritis and colitis, unspecified: Secondary | ICD-10-CM | POA: Diagnosis not present

## 2023-08-20 LAB — ENDOMYSIAL IGA ANTIBODY: Endomysial IgA: NEGATIVE

## 2023-08-20 LAB — CLOSTRIDIUM DIFFICILE TOXIN B, QUALITATIVE, REAL-TIME PCR: Toxigenic C. Difficile by PCR: NOT DETECTED

## 2023-08-21 ENCOUNTER — Encounter: Payer: Self-pay | Admitting: Gastroenterology

## 2023-08-21 DIAGNOSIS — K529 Noninfective gastroenteritis and colitis, unspecified: Secondary | ICD-10-CM | POA: Insufficient documentation

## 2023-08-24 DIAGNOSIS — L814 Other melanin hyperpigmentation: Secondary | ICD-10-CM | POA: Diagnosis not present

## 2023-08-24 DIAGNOSIS — L578 Other skin changes due to chronic exposure to nonionizing radiation: Secondary | ICD-10-CM | POA: Diagnosis not present

## 2023-08-24 DIAGNOSIS — L570 Actinic keratosis: Secondary | ICD-10-CM | POA: Diagnosis not present

## 2023-08-24 DIAGNOSIS — D229 Melanocytic nevi, unspecified: Secondary | ICD-10-CM | POA: Diagnosis not present

## 2023-08-24 DIAGNOSIS — L821 Other seborrheic keratosis: Secondary | ICD-10-CM | POA: Diagnosis not present

## 2023-08-26 LAB — HEPATITIS B SURFACE ANTIGEN: Hepatitis B Surface Ag: NONREACTIVE

## 2023-08-26 LAB — HIV ANTIBODY (ROUTINE TESTING W REFLEX): HIV 1&2 Ab, 4th Generation: NONREACTIVE

## 2023-08-26 LAB — QUANTIFERON-TB GOLD PLUS
Mitogen-NIL: >10 [IU]/mL
NIL: 0.04 [IU]/mL
QuantiFERON-TB Gold Plus: NEGATIVE
TB1-NIL: 0.01 [IU]/mL
TB2-NIL: 0.01 [IU]/mL

## 2023-08-26 LAB — HEPATITIS A ANTIBODY, TOTAL: Hepatitis A AB,Total: NONREACTIVE

## 2023-08-26 LAB — HEPATITIS C ANTIBODY: Hepatitis C Ab: NONREACTIVE

## 2023-08-26 LAB — THIOPURINE METHYLTRANSFERASE (TPMT), RBC: Thiopurine Methyltransferase, RBC: 15 nmol/h/mL

## 2023-08-26 LAB — HEPATITIS B CORE ANTIBODY, TOTAL: Hep B Core Total Ab: NONREACTIVE

## 2023-08-26 LAB — HEPATITIS B SURFACE ANTIBODY,QUALITATIVE: Hep B S Ab: NONREACTIVE

## 2023-09-07 ENCOUNTER — Other Ambulatory Visit: Payer: BC Managed Care – PPO

## 2023-09-07 DIAGNOSIS — K529 Noninfective gastroenteritis and colitis, unspecified: Secondary | ICD-10-CM | POA: Diagnosis not present

## 2023-09-10 LAB — CALPROTECTIN, FECAL: Calprotectin, Fecal: 46 ug/g (ref 0–120)

## 2023-09-15 ENCOUNTER — Other Ambulatory Visit: Payer: Self-pay

## 2023-09-15 DIAGNOSIS — K529 Noninfective gastroenteritis and colitis, unspecified: Secondary | ICD-10-CM

## 2023-10-05 ENCOUNTER — Other Ambulatory Visit: Payer: BC Managed Care – PPO

## 2023-10-05 DIAGNOSIS — K529 Noninfective gastroenteritis and colitis, unspecified: Secondary | ICD-10-CM

## 2023-10-09 LAB — CALPROTECTIN, FECAL: Calprotectin, Fecal: 465 ug/g — ABNORMAL HIGH (ref 0–120)

## 2023-10-11 ENCOUNTER — Other Ambulatory Visit: Payer: Self-pay

## 2023-10-11 DIAGNOSIS — K529 Noninfective gastroenteritis and colitis, unspecified: Secondary | ICD-10-CM

## 2023-10-11 MED ORDER — MESALAMINE 1.2 G PO TBEC: 2.4000 g | DELAYED_RELEASE_TABLET | Freq: Two times a day (BID) | ORAL | 0 refills | Status: DC

## 2023-10-11 NOTE — Telephone Encounter (Signed)
Called and discussed his issues with him on a 15 minute phone call. We we reviewed his endoscopy findings. Plan for patient to restart Lialda 4.8 g daily (he still has 1 bottle of medication available).  He will reach out to [place] if he needs any further. Holding on repeat steroid taper for now. 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 His elevation in his fecal calprotectin higher than it has been previously but with some few days post fecal calprotectin being administered of significant diarrhea suggest to be more of an inflammatory/infectious etiology around that time. Will plan to allow the patient to continue Lialda therapy moving forward as a potential maintenance for indeterminate colitis. In 3 weeks (the week of January 20,) we will have the patient reach back out to [place] to let us know symptomatology as to how he is doing and he will do that via MyChart. He will call us sooner if needed. If patient is not doing/having further improvement, we will consider 1 more steroid taper. Fecal calprotectin at end of January or beginning of February. Patient agrees with this plan of action.  Beth, Please make sure that there are Lialda refills at the pharmacy. Please make sure that we have a fecal calprotectin order in place. Thanks. GM

## 2023-10-15 NOTE — Telephone Encounter (Signed)
 Will plan for patient to initiate steroid taper. Will also plan for patient to give a stool culture to rule out C. difficile (PCR). Steroid taper as follows: Prednisone  40 mg daily x 10 days Prednisone  30 mg daily x 10 days Prednisone  20 mg daily x 10 days Prednisone  10 mg daily x 10 days Continue mesalamine  for now Thanks. GM

## 2023-10-18 ENCOUNTER — Telehealth: Payer: Self-pay | Admitting: Gastroenterology

## 2023-10-18 ENCOUNTER — Other Ambulatory Visit: Payer: BC Managed Care – PPO

## 2023-10-18 MED ORDER — PREDNISONE 10 MG PO TABS: ORAL_TABLET | ORAL | 0 refills | Status: DC

## 2023-10-18 NOTE — Telephone Encounter (Signed)
 See pt MyChart message.

## 2023-10-18 NOTE — Addendum Note (Signed)
 Addended by: Loretha Stapler on: 10/18/2023 08:45 AM   Modules accepted: Orders

## 2023-10-18 NOTE — Telephone Encounter (Signed)
 Inbound call from patient, following up on call below. He states he discussed a treatment plan wit Dr. Wilhelmenia due to the colitis. He states his symptoms are not improving. Patient was scheduled for follow up with Alan on 1/8 at 9:00 AM. He would still like to discuss his symptoms with a nurse.

## 2023-10-18 NOTE — Telephone Encounter (Signed)
 Patient called and stated that he needs an extension on his Prednisone due to his symptoms worsening over the weekend. Please advise.

## 2023-10-19 ENCOUNTER — Other Ambulatory Visit: Payer: BC Managed Care – PPO

## 2023-10-19 DIAGNOSIS — K529 Noninfective gastroenteritis and colitis, unspecified: Secondary | ICD-10-CM

## 2023-10-20 ENCOUNTER — Ambulatory Visit: Payer: BC Managed Care – PPO | Admitting: Physician Assistant

## 2023-10-21 LAB — CALPROTECTIN, FECAL: Calprotectin, Fecal: 2250 ug/g — ABNORMAL HIGH (ref 0–120)

## 2023-10-21 LAB — CLOSTRIDIUM DIFFICILE BY PCR: Toxigenic C. Difficile by PCR: NEGATIVE

## 2023-11-02 NOTE — Telephone Encounter (Signed)
Patient called requesting to speak with you regarding the Mesalamine he said he would like to discuss further.

## 2023-11-02 NOTE — Telephone Encounter (Signed)
The pt needed to make an appt- does not need anything further.  He will discuss next steps with the provider.

## 2023-11-18 DIAGNOSIS — H5711 Ocular pain, right eye: Secondary | ICD-10-CM | POA: Diagnosis not present

## 2023-11-25 NOTE — Telephone Encounter (Signed)
Patty, Bump him back up to 30 until he sees Deanna in clinic and then we will make a plan from there. Thanks. GM

## 2023-11-26 MED ORDER — PREDNISONE 20 MG PO TABS: 30.0000 mg | ORAL_TABLET | Freq: Every day | ORAL | 0 refills | Status: DC

## 2023-11-26 NOTE — Addendum Note (Signed)
Addended by: Loretha Stapler on: 11/26/2023 08:54 AM   Modules accepted: Orders

## 2023-12-02 NOTE — Progress Notes (Signed)
 Chief Complaint: follow-up colitis Primary GI Doctor: Dr. Meridee Score  HPI: Jaime Curtis is a 63 y.o. male with a pmh significant for hypertension, hyperlipidemia, nephrolithiasis, family history colon cancer (mother), sister (Crohn's), chronic diarrhea (colonoscopy with some findings of chronic colitis) versus IBS-D who presents for follow-up.  On 08/18/2023 patient last seen in GI office by Dr. Meridee Score.  At that time patient was having persistent and potentially worsening GI symptoms.  Despite a 2-month course of mesalamine therapy 4.8 g the patient reported no significant improvement in symptoms.  Patient described having increase in bowel movements for at least the last few weeks where he would have up to 6-8 bowel movements per day primarily liquid in consistency.  Patient also reports nocturnal symptoms.  Dr. Meridee Score provided patient with steroid taper.  On 10/09/23 patient contacted office after he completed steroid taper stating he was having more liquid/explosive bowel movements.  Dr. Irish Lack Roddy discussed with patient On 10/11/2023 Dr. Meridee Score spoke with patient and restarted Lialda 4.8 g daily and continue moving forward as a potential maintenance for indeterminate colitis. On 10/15/2023 patient complains of explosive and liquid diarrhea.  Patient started on steroid taper.  Continue mesalamine for now. On 10/31/2023 patient reports he is on day 4 for of steroid taper at 30 mg p.o. daily.  Patient reports his bowel movements are very loose and watery.  Productive bowel movements 2-3 times per day with mucus at night.  Patient will be up 3-4 times at night.  He did notice blood with mucus-like and bowel movements. On 11/25/2023 patient on prednisone 20 mg per daily.  States his symptoms are worsening and having liquid stools up to 4 times per day.Dr. Meridee Score bumped him back up to 30mg  po daily until appt.   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 Patient started on first steroid taper back in November and reports he felt much better. Shortly after he finished the first taper he started to have reoccurrence of symptoms with liquid/explosive diarrhea. Patient recently received a second taper of steroids and did well on 30-40 mg daily, but once he tapered down to 20 mg his symptoms would increase. Patient currently on prednisone 30 mg po daily for the past two weeks. Off the Lialda. He is currently going to restroom once per day. No blood in stool or abdominal pain. No extraintestinal manifestations. Denies nausea, vomiting, or weight loss. Weight stable, has gained some with steroids.   Patient is also on esomeprazole 20 mg po daily for esophagitis grade A found on endoscopy in August. He has no typical symptoms of GERD.  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     atorvastatin (LIPITOR) 40 MG tablet Take 1 tablet (40 mg total) by mouth daily. 90 tablet 3   irbesartan (AVAPRO) 75 MG tablet TAKE 1 TABLET DAILY 90 tablet 3   predniSONE (DELTASONE) 20 MG tablet Take 1.5 tablets (30 mg total) by mouth daily with breakfast. 100 tablet 0   esomeprazole (NEXIUM) 20 MG capsule Take 1 capsule (20 mg total) by mouth daily at 12 noon. 90 capsule 2   No current facility-administered medications for this visit.    Allergies as of 12/06/2023 - Review Complete 12/06/2023  Allergen Reaction Noted   Pseudoephedrine Other (See Comments) 12/07/2007    Family History  Problem Relation Age of Onset   Colon cancer Mother 16       doing well s/p colon surgery   Sudden death Father    Diabetes Sister    Crohn's disease Sister    Heart attack Sister    Cancer Maternal Grandmother        type unknown   Rectal cancer Neg Hx    Stomach cancer Neg Hx    Liver disease Neg Hx    Esophageal cancer Neg Hx    Inflammatory bowel disease Neg Hx    Pancreatic cancer Neg Hx     Review of Systems:    Constitutional: No weight loss, fever, chills, weakness or fatigue HEENT: Eyes: No change in vision               Ears, Nose, Throat:  No change in hearing or congestion Skin: No rash or itching Cardiovascular: No chest pain, chest pressure or palpitations   Respiratory: No SOB or cough Gastrointestinal: See HPI and otherwise negative Genitourinary: No dysuria or change in urinary frequency Neurological: No headache, dizziness or syncope Musculoskeletal: No new muscle or joint pain Hematologic: No bleeding or bruising Psychiatric: No history of depression or anxiety    Physical Exam:  Vital signs: BP 104/60   Pulse 74   Ht 5\' 8"  (1.727 m)   Wt 187 lb (84.8 kg)   BMI 28.43 kg/m   Constitutional:   Pleasant Caucasian male appears to be in NAD, Well developed, Well  nourished, alert and cooperative Throat: Oral cavity and pharynx without inflammation, swelling or lesion.  Respiratory: Respirations even and unlabored. Lungs clear to auscultation bilaterally.   No wheezes, crackles, or rhonchi.  Cardiovascular: Normal S1, S2. Regular rate and rhythm. No peripheral edema, cyanosis or pallor.  Gastrointestinal:  Soft, nondistended, nontender. No rebound or guarding. Normal bowel sounds. No appreciable masses or hepatomegaly. Rectal:  Not performed.  Msk:  Symmetrical without gross deformities. Without edema, no deformity or joint abnormality.  Neurologic:  Alert and  oriented x4;  grossly normal neurologically.  Skin:   Dry and intact without significant lesions or rashes. Psychiatric: Oriented to person, place and time. Demonstrates good judgement and reason without abnormal affect or behaviors.  RELEVANT LABS AND IMAGING: CBC  Latest Ref Rng & Units 08/18/2023   11:06 AM 06/18/2023    7:26 AM 02/09/2023    7:38 AM  CBC  WBC 4.0 - 10.5 K/uL 7.1  6.4  7.5   Hemoglobin 13.0 - 17.0 g/dL 16.1  09.6  04.5   Hematocrit 39.0 - 52.0 % 47.1  45.2  44.7   Platelets 150.0 - 400.0 K/uL 273.0  229.0  232.0      CMP     Latest Ref Rng & Units 08/18/2023   11:06 AM 06/18/2023    7:26 AM 02/09/2023    7:38 AM  CMP  Glucose 70 - 99 mg/dL 92  88  88   BUN 6 - 23 mg/dL 16  20  17    Creatinine 0.40 - 1.50 mg/dL 4.09  8.11  9.14   Sodium 135 - 145 mEq/L 136  144  140   Potassium 3.5 - 5.1 mEq/L 4.1  4.5  4.4   Chloride 96 - 112 mEq/L 104  110  106   CO2 19 - 32 mEq/L 26  27  28    Calcium 8.4 - 10.5 mg/dL 8.9  8.9  8.8   Total Protein 6.0 - 8.3 g/dL 6.4  5.7  5.9   Total Bilirubin 0.2 - 1.2 mg/dL 0.6  0.5  0.6   Alkaline Phos 39 - 117 U/L 63  55  50   AST 0 - 37 U/L 18  18  21    ALT 0 - 53 U/L 20  20  16       Lab Results  Component Value Date   TSH 1.45 02/09/2023    GI procedures:  August 2024 EGD - No gross lesions in the majority of the esophagus.. -  LA Grade A esophagitis with no bleeding found in very distal esophagus. - Z-line irregular, 41 cm from the incisors. - 1 cm hiatal hernia. - Erythematous mucosa in the stomach. Biopsied. - No gross lesions in the duodenal bulb, in the first portion of the duodenum and in the second portion of the duodenum. Biopsied.   August 2024 colonoscopy - Hemorrhoids found on digital rectal exam. - The examined portion of the ileum was normal. - Granularity in the left colon. Biopsied. - Granularity in the right colon. Biopsied. - Normal mucosa in the rectum. Biopsied. - Non-bleeding non-thrombosed external and internal hemorrhoids.   Pathology Diagnosis 1. Surgical [P], random gastric sites - ANTRAL AND OXYNTIC MUCOSA WITH MILD CHRONIC INFLAMMATION, CONGESTION AND MILD REACTIVE (CHEMICAL) GASTROPATHY - IMMUNOHISTOCHEMICAL STAIN FOR HELICOBACTER ORGANISMS IS NEGATIVE 2. Surgical [P], duodenum - BENIGN DUODENAL MUCOSA WITH FOCAL, PATCHY MILDLY INCREASED INTRAEPITHELIAL LYMPHOCYTES - NEGATIVE FOR SIGNIFICANT ACTIVE INFLAMMATION - SEE NOTE 3. Surgical [P], right colon - COLONIC MUCOSA WITH LYMPHOID AGGREGATES AND MILD ACTIVE COLITIS - NEGATIVE FOR DEFINITIVE FEATURES OF CHRONICITY, GRANULOMATA, DYSPLASIA OR MALIGNANCY - SEE NOTE 4. Surgical [P], left colon - COLONIC MUCOSA WITH MILD ACTIVE, FOCAL CHRONIC COLITIS - NEGATIVE FOR GRANULOMATA, DYSPLASIA OR MALIGNANCY - SEE NOTE 5. Surgical [P], colon, rectum - COLONIC MUCOSA WITH FOCAL LYMPHOID AGGREGATES - NEGATIVE FOR DEFINITIVE FEATURES OF CHRONICITY, GRANULOMATA, DYSPLASIA OR MALIGNANCY Diagnosis Note 2. These findings are non-specific. Possible etiologies include immune-mediated injuries, infections, medications and peptic disease. Clinical correlation with appropriate serologic studies should be considered; if clinically indicated. 3. The histologic features are non-specific and can be seen in low grade ischemia, medication effect,  self-limited infectious processes and inflammatory bowel disease. Clinical-pathologic correlation is recommended. 4. The histologic features are  non-specific and can be seen in low grade ischemia, medication effect, self-limited infectious processes, inflammatory bowel disease and biopsy changes adjacent to diverticular disease. Clinical-pathologic correlation is recommended.   Assessment: Encounter Diagnoses  Name Primary?   Gastroesophageal reflux disease with esophagitis without hemorrhage Yes   Chronic colitis    Family history of colon cancer    Abnormal colonoscopy      63 year old male patient with colonoscopy suggesting chronic changes within the left colon. Patient trialed mesalamine which did not seem to be very helpful. His fecal calprotectin has increased from 46 to 2,250 in less than 2 months despite steroid taper. He is currently on 30 mg of Prednisone (second taper) and his symptoms are manageable. If we reduce his dosage his symptoms increase again. Will discuss case with Dr. Meridee Score and our IBD specialist Dr. Doy Hutching. Patient is agreeable to have repeat colonoscopy with biopsy.    For the GERD with esophagitis patient will continue current regimen, no changes made at this time.   Plan: -Continue esomeprazole 20 mg po daily, refilled -GERD diet, no late meals -Continue prednisone 30 mg po daily -future colonoscopy to be determined  Thank you for the courtesy of this consult. Please call me with any questions or concerns.   Hazen Brumett, FNP-C [facility] 12/06/2023, 9:34 AM  Cc: Corwin Levins, MD

## 2023-12-06 ENCOUNTER — Ambulatory Visit: Payer: BC Managed Care – PPO | Admitting: Gastroenterology

## 2023-12-06 ENCOUNTER — Encounter: Payer: Self-pay | Admitting: Gastroenterology

## 2023-12-06 VITALS — BP 104/60 | HR 74 | Ht 68.0 in | Wt 187.0 lb

## 2023-12-06 DIAGNOSIS — K2100 Gastro-esophageal reflux disease with esophagitis, without bleeding: Secondary | ICD-10-CM

## 2023-12-06 DIAGNOSIS — R933 Abnormal findings on diagnostic imaging of other parts of digestive tract: Secondary | ICD-10-CM | POA: Diagnosis not present

## 2023-12-06 DIAGNOSIS — Z800 Family history of malignant neoplasm of digestive organs: Secondary | ICD-10-CM

## 2023-12-06 DIAGNOSIS — K529 Noninfective gastroenteritis and colitis, unspecified: Secondary | ICD-10-CM | POA: Diagnosis not present

## 2023-12-06 MED ORDER — ESOMEPRAZOLE MAGNESIUM 20 MG PO CPDR: 20.0000 mg | DELAYED_RELEASE_CAPSULE | Freq: Every day | ORAL | 2 refills | Status: AC

## 2023-12-06 NOTE — Patient Instructions (Signed)
 We have sent the following medications to your pharmacy for you to pick up at your convenience:  Esomeprazole  We will call you to discuss a colonoscopy.  _______________________________________________________  If your blood pressure at your visit was 140/90 or greater, please contact your primary care physician to follow up on this.  _______________________________________________________  If you are age 62 or older, your body mass index should be between 23-30. Your Body mass index is 28.43 kg/m. If this is out of the aforementioned range listed, please consider follow up with your Primary Care Provider.  If you are age 16 or younger, your body mass index should be between 19-25. Your Body mass index is 28.43 kg/m. If this is out of the aformentioned range listed, please consider follow up with your Primary Care Provider.   ________________________________________________________  The [facility] providers would like to encourage you to use [facility] to communicate with providers for non-urgent requests or questions.  Due to long hold times on the telephone, sending your provider a message by [facility] may be a faster and more efficient way to get a response.  Please allow 48 business hours for a response.  Please remember that this is for non-urgent requests.  _______________________________________________________

## 2023-12-07 ENCOUNTER — Telehealth: Payer: Self-pay | Admitting: *Deleted

## 2023-12-07 DIAGNOSIS — K529 Noninfective gastroenteritis and colitis, unspecified: Secondary | ICD-10-CM

## 2023-12-07 MED ORDER — NA SULFATE-K SULFATE-MG SULF 17.5-3.13-1.6 GM/177ML PO SOLN: ORAL | 0 refills | Status: DC

## 2023-12-07 NOTE — Telephone Encounter (Signed)
 I have spoken to patient to advise that Dr Doy Hutching, our IBD specialist has agreed to see him in consult for his refractory symptoms. She feels it would be best to first perform repeat colonoscopy for update views/biopsies of colon and then have him come in for office visit after to discuss future treatment recommendations. Patient is agreeable to this.  I offered the patient colonoscopy appointment date 12/14/23 but he states this date will not work. Next available date is 01/13/24 is also not preferable. He has instead chosen 01/14/24 procedure. Patient has been advised of time/date/location for upcoming procedure. Discussed that a care partner 18 years or older should bring him, stay for the procedure and drive home due to sedation. Written instructions have been made available to the patient for additional review via MyChart.  In addition, patient has been scheduled to see Dr Doy Hutching in follow up on Monday, 01/24/24 at 2:10 pm.

## 2023-12-07 NOTE — Progress Notes (Signed)
 Attending Physician's Attestation   I have reviewed the chart.   I agree with the Advanced Practitioner's note, impression, and recommendations with any updates as below. This patient most likely has underlying IBD based on overall clinical history as well as prior and most recent pathology.  However his significant worsening of symptoms while being on steroids as opposed to when he was off steroids and elevated fecal calprotectin is beyond what I would normally think is typical findings for his otherwise only left-sided disease.  I think an updated colonoscopy makes sense.  We will work with our IBD specialist, Dr. Doy Hutching, to see if she or I have colonoscopy availability to move forward with this and subsequently given opinion based on final pathology.  For now maintain steroids at 30 mg daily until colonoscopy.   Corliss Parish, MD [facility] Advanced Endoscopy Office # [phone]

## 2023-12-07 NOTE — Telephone Encounter (Signed)
 ----- Message from Durene Romans McGreal sent at 12/06/2023  6:00 PM EST ----- Regarding: RE: Follow-up I would be happy to assist in his care.  I think it would be most fruitful to perform a colonoscopy at the next available date with a subsequent clinic visit with me to review results.  Dottie -can you please review my schedules to see when my next available colonoscopy slot is (hopefully soon) and offer that to Jaime Curtis.  Once a colonoscopy date is set you can book him for a clinic visit with me after the colonoscopy.  If all of the slots are filled you can use one of our reserved slots.  Harriett Sine ----- Message ----- From: Lemar Lofty., MD Sent: 12/06/2023   5:13 PM EST To: Margarite Gouge May, NP; Ottie Glazier, MD Subject: RE: Follow-up                                  DJM, Let's discuss with her.  NM, This is apatient I inherited from one of our partners.  Saw him in follow-up because he had had "years" of changes in bowel habits of an unclear etiology, though back in 2016 he had a colonoscopy that did show evidence of focal active colitis with suggestion of minimal active IBD.  However he did not come back for follow-up until last year, 2024.  When I saw him I ended up eventually bringing him in for a colonoscopy.  At the time of colonoscopy endoscopically was more granular in appearance without overt ulceration throughout.  While his right colon was normal on pathology and his rectum was normal on pathology.  His left colon had focal active chronic colitis on pathology.   I started him on mesalamine therapy which did not seem to be too effective.   We put him on a steroid therapy/trial at the end of last year which helped him but once he came off of it completely had worsening symptoms.   Put him back on mesalamine and steroids and there was an acute exacerbation which caused [place] to go ahead and stop Lialda therapy but to keep him on prednisone.  He subsequently has had difficulty trying  to down titrate under 30 mg without significant symptomatology and increasing bowel habits.  Has had elevated fecal calprotectin (2250) much higher than he had previously with negative stool studies otherwise. Now coming back in, with persisting symptoms. I think an updated colonoscopy as it has been over 8 months makes sense for [place] to just confirm what our thoughts are in regards underlying inflammatory bowel disease with progressive symptomatology that now requires likely biologic therapy. He would like an opportunity to have a discussion with an IBD expert as well after seeing Deanna today. If you are willing, I think it would be reasonable to move forward with this request. You may have an earlier colonoscopy slot than I would, and if that is the case and if you agree with colonoscopy needs, we could get him scheduled with you to do a colonoscopy see if we see the findings that we think we will, and then can make some decisions about longer-term biologic therapy. If you wanted to just see in clinic first to discuss things or had other recommendations in the interim, we can go that route too. Appreciate your thoughts and time. GM ----- Message ----- From: May, Deanna J, NP Sent: 12/06/2023   9:32 AM  EST To: Lemar Lofty., MD Subject: Follow-up                                      Dr. Meridee Score,   Good morning! So I spoke with patient and he was actually going to ask about referral to [facility] with IBD specialist but I told him we have Dr. Doy Hutching here. He was asking if he could have colonoscopy with her and hear her recommendations. I wanted to see if that was ok with you before I scheduled anything. Overall he is doing ok if he stays on the 30 mg of prednisone but I explained to him we can't keep him on that longterm. Thanks!  Deanna, NP-C

## 2023-12-08 ENCOUNTER — Encounter: Payer: Self-pay | Admitting: Pediatrics

## 2023-12-13 NOTE — Progress Notes (Unsigned)
 Jaime Curtis History and Physical   Primary Care Physician:  Corwin Levins, MD   Reason for Procedure:  Diarrhea, suspected inflammatory bowel disease  Plan:    Diagnostic colonoscopy   HPI: Jaime Curtis is a 63 y.o. male undergoing diagnostic colonoscopy for evaluation of diarrhea and suspected diagnosis of Crohn's disease.  Jaime Curtis underwent most recent colonoscopy 05/20/2023 for colon cancer screening with incidental notation of diarrhea.  That procedure showed mild diffuse granularity throughout the colon with biopsies demonstrating chronic changes in the left colon.  He was subsequently managed with mesalamine which was ineffective and required 2 prednisone tapers.  Most recently has responded to high-dose corticosteroids with recurrent symptoms when tapered to 20 mg.  Fecal calprotectin 10/2022 elevated at 2250.  Restaging colonoscopy is performed today.   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 Authorizing Provider  aspirin 81 MG tablet Take 81 mg by mouth daily.    [provider]  atorvastatin (LIPITOR) 40 MG tablet  Take 1 tablet (40 mg total) by mouth daily. 02/16/23   Corwin Levins, MD  esomeprazole (NEXIUM) 20 MG capsule Take 1 capsule (20 mg total) by mouth daily at 12 noon. 12/06/23   May, Deanna J, NP  irbesartan (AVAPRO) 75 MG tablet TAKE 1 TABLET DAILY 02/16/23   Corwin Levins, MD  Na Sulfate-K Sulfate-Mg Sulfate concentrate (SUPREP) 17.5-3.13-1.6 GM/177ML SOLN Use as directed; may use generic; goodrx card if insurance will not cover generic 12/07/23   Ottie Glazier, MD  predniSONE (DELTASONE) 20 MG tablet Take 1.5 tablets (30 mg total) by mouth daily with breakfast. 11/26/23   Mansouraty, Netty Starring., MD    Current Outpatient Medications  Medication Sig Dispense Refill   aspirin 81 MG tablet Take 81 mg by mouth daily.     atorvastatin (LIPITOR) 40 MG tablet Take 1 tablet (40 mg total) by mouth daily. 90 tablet 3   esomeprazole (NEXIUM) 20 MG capsule Take 1 capsule (20 mg total) by mouth daily at 12 noon. 90 capsule 2   irbesartan (AVAPRO) 75 MG tablet TAKE 1 TABLET DAILY 90 tablet 3   Na Sulfate-K Sulfate-Mg Sulfate concentrate (SUPREP) 17.5-3.13-1.6 GM/177ML SOLN Use as directed; may use generic; goodrx card if insurance will not cover generic 354 mL 0   predniSONE (DELTASONE) 20 MG tablet Take 1.5 tablets (30 mg total) by mouth daily with breakfast. 100 tablet 0   No current facility-administered medications for this visit.  Allergies as of 12/14/2023 - Review Complete 12/06/2023  Allergen Reaction Noted   Pseudoephedrine Other (See Comments) 12/07/2007    Family History  Problem Relation Age of Onset   Colon cancer Mother 68       doing well s/p colon surgery   Sudden death Father    Diabetes Sister    Crohn's disease Sister    Heart attack Sister    Cancer Maternal Grandmother        type unknown   Rectal cancer Neg Hx    Stomach cancer Neg Hx    Liver disease Neg Hx    Esophageal cancer Neg Hx    Inflammatory bowel disease Neg Hx    Pancreatic cancer Neg Hx     Social  History   Socioeconomic History   Marital status: Married    Spouse name: Not on file   Number of children: 3   Years of education: Not on file   Highest education level: Not on file  Occupational History   Occupation: Field seismologist  Tobacco Use   Smoking status: Never   Smokeless tobacco: Never  Vaping Use   Vaping status: Never Used  Substance and Sexual Activity   Alcohol use: Yes    Alcohol/week: 0.0 standard drinks of alcohol    Comment: socially   Drug use: No   Sexual activity: Not on file  Other Topics Concern   Not on file  Social History Narrative   Not on file   Social Drivers of Health   Financial Resource Strain: Not on file  Food Insecurity: Not on file  Transportation Needs: Not on file  Physical Activity: Not on file  Stress: Not on file  Social Connections: Not on file  Intimate Partner Violence: Not on file    Review of Systems:  All other review of systems negative except as mentioned in the HPI.  Physical Exam: Vital signs There were no vitals taken for this visit.  General:   Alert,  Well-developed, well-nourished, pleasant and cooperative in NAD Airway:  Mallampati  Lungs:  Clear throughout to auscultation.   Heart:  Regular rate and rhythm; no murmurs, clicks, rubs,  or gallops. Abdomen:  Soft, nontender and nondistended. Normal bowel sounds.   Neuro/Psych:  Normal mood and affect. A and O x 3  [name], MD [facility]

## 2023-12-14 ENCOUNTER — Ambulatory Visit: Payer: BC Managed Care – PPO | Admitting: Pediatrics

## 2023-12-14 ENCOUNTER — Encounter: Payer: Self-pay | Admitting: Pediatrics

## 2023-12-14 VITALS — BP 110/74 | HR 99 | Temp 98.9°F | Resp 18 | Ht 68.0 in | Wt 187.0 lb

## 2023-12-14 DIAGNOSIS — K648 Other hemorrhoids: Secondary | ICD-10-CM | POA: Diagnosis not present

## 2023-12-14 DIAGNOSIS — K6389 Other specified diseases of intestine: Secondary | ICD-10-CM

## 2023-12-14 DIAGNOSIS — K5000 Crohn's disease of small intestine without complications: Secondary | ICD-10-CM | POA: Diagnosis not present

## 2023-12-14 DIAGNOSIS — K529 Noninfective gastroenteritis and colitis, unspecified: Secondary | ICD-10-CM | POA: Diagnosis not present

## 2023-12-14 MED ORDER — SODIUM CHLORIDE 0.9 % IV SOLN: 500.0000 mL | Freq: Once | INTRAVENOUS | Status: DC

## 2023-12-14 NOTE — Patient Instructions (Signed)
 -  Resume previous diet. - Continue present medications. - Await pathology results.  YOU HAD AN ENDOSCOPIC PROCEDURE TODAY AT THE [facility]:   Refer to the procedure report that was given to you for any specific questions about what was found during the examination.  If the procedure report does not answer your questions, please call your gastroenterologist to clarify.  If you requested that your care partner not be given the details of your procedure findings, then the procedure report has been included in a sealed envelope for you to review at your convenience later.  YOU SHOULD EXPECT: Some feelings of bloating in the abdomen. Passage of more gas than usual.  Walking can help get rid of the air that was put into your GI tract during the procedure and reduce the bloating. If you had a lower endoscopy (such as a colonoscopy or flexible sigmoidoscopy) you may notice spotting of blood in your stool or on the toilet paper. If you underwent a bowel prep for your procedure, you may not have a normal bowel movement for a few days.  Please Note:  You might notice some irritation and congestion in your nose or some drainage.  This is from the oxygen used during your procedure.  There is no need for concern and it should clear up in a day or so.  SYMPTOMS TO REPORT IMMEDIATELY:  Following lower endoscopy (colonoscopy or flexible sigmoidoscopy):  Excessive amounts of blood in the stool  Significant tenderness or worsening of abdominal pains  Swelling of the abdomen that is new, acute  Fever of 100F or higher  Following upper endoscopy (EGD)  Vomiting of blood or coffee ground material  New chest pain or pain under the shoulder blades  Painful or persistently difficult swallowing  New shortness of breath  Fever of 100F or higher  Black, tarry-looking stools  For urgent or emergent issues, a gastroenterologist can be reached at any hour by calling [phone]. Do not use MyChart  messaging for urgent concerns.    DIET:  We do recommend a small meal at first, but then you may proceed to your regular diet.  Drink plenty of fluids but you should avoid alcoholic beverages for 24 hours.  ACTIVITY:  You should plan to take it easy for the rest of today and you should NOT DRIVE or use heavy machinery until tomorrow (because of the sedation medicines used during the test).    FOLLOW UP: Our staff will call the number listed on your records the next business day following your procedure.  We will call around 7:15- 8:00 am to check on you and address any questions or concerns that you may have regarding the information given to you following your procedure. If we do not reach you, we will leave a message.     If any biopsies were taken you will be contacted by phone or by letter within the next 1-3 weeks.  Please call us at [phone] if you have not heard about the biopsies in 3 weeks.    SIGNATURES/CONFIDENTIALITY: You and/or your care partner have signed paperwork which will be entered into your electronic medical record.  These signatures attest to the fact that that the information above on your After Visit Summary has been reviewed and is understood.  Full responsibility of the confidentiality of this discharge information lies with you and/or your care-partner.

## 2023-12-14 NOTE — Op Note (Signed)
 [facility] Patient Name: Jaime Curtis Procedure Date: 12/14/2023 10:28 AM MRN: [id number] Endoscopist: [name] , MD, [id number] Age: 63 Referring MD:  Date of Birth: April 26, 1961 Gender: Male Account #: [id number] Procedure:                Colonoscopy Indications:              Diarrhea, concern for suspected inflammatory bowel                            disease with need for high dose steroids to manage                            symptoms. Colonoscopy is performed today to                            reassess extent and severity of inflammation.                            Patient reports that he stopped prednisone 30 mg                            daily of his own volition shortly before today's                            procedure. Currently having 1 loose stool a day. No                            other IBD meds. Medicines:                Monitored Anesthesia Care Procedure:                Pre-Anesthesia Assessment:                           - Prior to the procedure, a History and Physical                            was performed, and patient medications and                            allergies were reviewed. The patient's tolerance of                            previous anesthesia was also reviewed. The risks                            and benefits of the procedure and the sedation                            options and risks were discussed with the patient.                            All questions were answered, and informed consent  was obtained. Prior Anticoagulants: The patient has                            taken no anticoagulant or antiplatelet agents. ASA                            Grade Assessment: II - A patient with mild systemic                            disease. After reviewing the risks and benefits,                            the patient was deemed in satisfactory condition to                            undergo the procedure.                            After obtaining informed consent, the colonoscope                            was passed under direct vision. Throughout the                            procedure, the patient's blood pressure, pulse, and                            oxygen saturations were monitored continuously. The                            Olympus CF-HQ190L ([id number]) Colonoscope was                            introduced through the anus and advanced to the                            terminal ileum. The colonoscopy was performed                            without difficulty. The patient tolerated the                            procedure well. The quality of the bowel                            preparation was good. The terminal ileum, ileocecal                            valve, appendiceal orifice, and rectum were                            photographed. Scope In: 10:41:23 AM Scope Out: 11:00:12 AM Scope Withdrawal Time: 0 hours 14 minutes 2 seconds  Total Procedure Duration: 0 hours 18 minutes 49 seconds  Findings:                 The perianal and digital rectal examinations were                            normal. Pertinent negatives include normal                            sphincter tone and no palpable rectal lesions.                           The colonic mucosa appeared mildly diffusely                            congested throughout the entire colon, right                            greater than left. Otherwise, there was no evidence                            of overt inflammatory changes. Colon was devoid of                            erythema, erosions, ulcerations. Biopsies were                            taken with a cold forceps for histology.                           The terminal ileum appeared normal. Biopsies were                            taken with a cold forceps for histology.                           Internal hemorrhoids were found during retroflexion. Complications:             No immediate complications. Estimated blood loss:                            Minimal. Estimated Blood Loss:     Estimated blood loss was minimal. Impression:               - Congested mucosa in the entire examined colon.                            This is a nonspecific finding and there is no other                            evidence of endoscopic inflammation?"no erythema,                            erosions or ulceration. Biopsied.                           - The  examined portion of the ileum was normal.                            Biopsied.                           - Internal hemorrhoids. Recommendation:           - Discharge patient to home (ambulatory).                           - Await pathology results.                           - Pending biopsy results, consider cross-sectional                            imaging in the form of a CTE or MRCP to evaluate                            for proximal and/or mid small bowel inflammation.                           - The findings and recommendations were discussed                            with the patient's family.                           - Return to GI clinic as previously scheduled.                           - Patient has a contact number available for                            emergencies. The signs and symptoms of potential                            delayed complications were discussed with the                            patient. Return to normal activities tomorrow.                            Written discharge instructions were provided to the                            patient. [name], MD 12/14/2023 11:09:08 AM This report has been signed electronically.

## 2023-12-14 NOTE — Progress Notes (Signed)
 Vss nad trans to pacu

## 2023-12-14 NOTE — Progress Notes (Signed)
 Pt's states no medical or surgical changes since previsit or office visit.

## 2023-12-14 NOTE — Progress Notes (Signed)
 Called to room to assist during endoscopic procedure.  Patient ID and intended procedure confirmed with present staff. Received instructions for my participation in the procedure from the performing physician.

## 2023-12-15 ENCOUNTER — Telehealth: Payer: Self-pay

## 2023-12-15 NOTE — Telephone Encounter (Signed)
  Follow up Call-     12/14/2023    9:56 AM 05/26/2023   12:57 PM  Call back number  Post procedure Call Back phone  # [phone]  Permission to leave phone message Yes Yes     Patient questions:  Do you have a fever, pain , or abdominal swelling? No. Pain Score  0 *  Have you tolerated food without any problems? Yes.    Have you been able to return to your normal activities? Yes.    Do you have any questions about your discharge instructions: Diet   No. Medications  No. Follow up visit  No.  Do you have questions or concerns about your Care? No.  Actions: * If pain score is 4 or above: No action needed, pain <4.

## 2023-12-16 ENCOUNTER — Encounter: Payer: Self-pay | Admitting: Pediatrics

## 2023-12-16 DIAGNOSIS — R933 Abnormal findings on diagnostic imaging of other parts of digestive tract: Secondary | ICD-10-CM

## 2023-12-16 DIAGNOSIS — K529 Noninfective gastroenteritis and colitis, unspecified: Secondary | ICD-10-CM

## 2023-12-16 LAB — SURGICAL PATHOLOGY

## 2023-12-19 ENCOUNTER — Encounter: Payer: Self-pay | Admitting: Pediatrics

## 2023-12-22 ENCOUNTER — Ambulatory Visit
Admission: RE | Admit: 2023-12-22 | Discharge: 2023-12-22 | Disposition: A | Discharge status: Home / Self Care | Source: Ambulatory Visit | Attending: Pediatrics | Admitting: Pediatrics

## 2023-12-22 DIAGNOSIS — K529 Noninfective gastroenteritis and colitis, unspecified: Secondary | ICD-10-CM

## 2023-12-22 DIAGNOSIS — R933 Abnormal findings on diagnostic imaging of other parts of digestive tract: Secondary | ICD-10-CM

## 2023-12-22 MED ORDER — IOPAMIDOL (ISOVUE-300) INJECTION 61%
100.0000 mL | Freq: Once | INTRAVENOUS | Status: AC | PRN
  Administered 2023-12-22: 100 mL via INTRAVENOUS

## 2024-01-03 MED ORDER — PREDNISONE 10 MG PO TABS: ORAL_TABLET | ORAL | 0 refills | Status: AC

## 2024-01-03 NOTE — Telephone Encounter (Signed)
 Patient with suspicious colitis, finished prednisone taper when he got down to 5 mg had worsening symptoms. Sent in prednisone 10 mg daily for 2 weeks 5 mg daily for 2 weeks, may need to increase for longer taper pending results.

## 2024-01-04 NOTE — Telephone Encounter (Signed)
 Called and spoke with Kennyth Arnold in the radiology reading room, report will be read ASAP. Thanks

## 2024-01-05 ENCOUNTER — Encounter: Payer: Self-pay | Admitting: Pediatrics

## 2024-01-14 ENCOUNTER — Encounter: Payer: BC Managed Care – PPO | Admitting: Pediatrics

## 2024-01-23 NOTE — Progress Notes (Addendum)
 [facility] Return Visit   Referring Provider Roslyn Coombe, MD [address] [place],  [place] [address]  Primary Care Provider Roslyn Coombe, MD  Patient Profile: Jaime Curtis is a 63 y.o. male with a past medical history noteworthy for sinus bradycardia, HTN, ascending aortic aneurysm, HLD,  who returns to the [facility] for follow-up of the problem(s) noted below.  Problem List: IBD-U/indeterminant colitis -most likely ulcerative colitis GERD History of LA grade A esophagitis Hiatal hernia Family history of colorectal cancer in a first-degree relative -mother Family history of Crohn's disease - sister   History of Present Illness   Jaime Curtis was last seen in the GI office 12/06/2023 by Dr. Brice Campi   Current GI Meds  Prednisone  5 mg daily Nexium  20 mg p.o. daily  Interval History  In speaking with Jaime Curtis as well as reviewing his prior medical records, his IBD history is noteworthy for the following -- Colonoscopy 2016 -diffusely nodular mucosa with focal active colitis on biopsy --he was asymptomatic at the time -- Spring 2024 -sought evaluation for loose stools and fecal urgency -- Colonoscopy 05/2023 -diffusely granular and congested mucosa throughout the colon, normal TI --mild active colitis and focal active colitis on biopsy -- Trialed mesalamine  4.8 g daily x 2 months without improvement -- Initiated prednisone  taper 08/2023 with initial response -fecal calprotectin 46; started Lialda  -- Had flare of symptoms in December 2024/January 2025 with elevated fecal calprotectin 465, 2250 --continued steroid taper -- Colonoscopy 12/2023 - diffusely congested mucosa throughout the entire colon, normal TI --chronic colitis and ileitis on path -- CTE 12/2023 - normal  -- At today's visit, Jaime Curtis reports that he is continuing to have 2-3 loose and somewhat urgent bowel movements a day despite prednisone  5 mg orally daily -- Bowel  movements tend to cluster in the morning -- No tenesmus or nocturnal bowel movements at this time -- Denies abdominal cramping -- No upper GI tract symptoms of GERD, nausea, vomiting, dysphagia or odynophagia  -- No extraintestinal manifestations of IBD-fevers, chills, joint pain, skin rashes or oral ulcers  -- Has gained approximately 10 pounds on prednisone -current weight 186 pounds  -- We spent the length of today's visit reviewing Jaime Curtis's prior history, diagnosis of IBD, response to steroids and mesalamine  and discussing future treatments. -- Reports that his symptoms were initially very responsive to steroid but he has seen a waning of response over time  Last colonoscopy: 12/2023 - diffusely congested mucosa throughout the entire colon; normal TI Last endoscopy: 05/2023 - LA grade A esophagitis, HH, mildly erythematous mucosa in stomach  Last Abd CT/CTE/MRE: CTE 12/2023 -no acute abnormality  GI Review of Symptoms Significant for diarrhea, fecal urgency. Otherwise negative.  General Review of Systems  Review of systems is significant for the pertinent positives and negatives as listed per the HPI.  Full ROS is otherwise negative.  Inflammatory Bowel Disease History  -- Colonoscopy 2016 -diffusely nodular mucosa with focal active colitis on biopsy --he was asymptomatic at the time -- Spring 2024 -sought evaluation for loose stools and fecal urgency -- Colonoscopy 05/2023 -diffusely granular and congested mucosa throughout the colon, normal TI --mild active colitis and focal active colitis on biopsy -- Trialed mesalamine  4.8 g daily x 2 months without improvement -- Initiated prednisone  taper 08/2023 with initial response -fecal calprotectin 46; started Lialda  -- Had flare of symptoms in December 2024/January 2025 with elevated fecal calprotectin 465, 2250 --continued steroid taper -- Colonoscopy 12/2023 - diffusely  congested mucosa throughout the entire colon, normal TI --chronic  colitis and ileitis on path -- CTE 12/2023 - normal  IBD Medication History Prednisone  -has noted a waning response to steroids over time Mesalamine -no response   Past Medical History   Past Medical History:  Diagnosis Date   ALLERGIC RHINITIS 12/07/2007   Allergy    Arthritis    Bradycardia, sinus 07/10/2011   CHEST PAIN 12/07/2007   DARK URINE 12/07/2007   Family history of colon cancer 08/11/2013   mother, age 47   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   HEMATOCHEZIA 10/03/2007   High blood pressure    HYPERLIPIDEMIA 12/07/2007   Hypertension 12/12/2016   NECK PAIN 11/18/2010   Pure hypercholesterolemia 08/18/2007   SINUSITIS- ACUTE-NOS 11/25/2009   URI 11/18/2010     Past Surgical History   Past Surgical History:  Procedure Laterality Date   baha bone anchored hearing aide Right    COLONOSCOPY     6-8 years ago   HAND SURGERY Right    HERNIA MESH REMOVAL     hx of fractured right scapula     inguinal herniorrhapy Bilateral    left   TONSILLECTOMY       Allergies and Medications   Allergies  Allergen Reactions   Pseudoephedrine Other (See Comments)   Current Meds  Medication Sig   aspirin 81 MG tablet Take 81 mg by mouth daily.   atorvastatin  (LIPITOR) 40 MG tablet Take 1 tablet (40 mg total) by mouth daily.   esomeprazole  (NEXIUM ) 20 MG capsule Take 1 capsule (20 mg total) by mouth daily at 12 noon.   irbesartan  (AVAPRO ) 75 MG tablet TAKE 1 TABLET DAILY   predniSONE  (DELTASONE ) 10 MG tablet Take 1 tablet (10 mg total) by mouth daily with breakfast for 14 days, THEN 0.5 tablets (5 mg total) daily with breakfast for 14 days.     Family History   Family History  Problem Relation Age of Onset   Colon cancer Mother 9       doing well s/p colon surgery   Sudden death Father    Diabetes Sister    Crohn's disease Sister    Heart attack Sister    Cancer Maternal Grandmother        type unknown   Rectal cancer  Neg Hx    Stomach cancer Neg Hx    Liver disease Neg Hx    Esophageal cancer Neg Hx    Inflammatory bowel disease Neg Hx    Pancreatic cancer Neg Hx    Sister with Crohn's disease  Social History   Social History   Tobacco Use   Smoking status: Never   Smokeless tobacco: Never  Vaping Use   Vaping status: Never Used  Substance Use Topics   Alcohol use: Yes    Alcohol/week: 0.0 standard drinks of alcohol    Comment: socially   Drug use: No   Kingsly reports that he has never smoked. He has never used smokeless tobacco. He reports current alcohol use. He reports that he does not use drugs.  Vital Signs and Physical Examination   Vitals:   01/24/24 1409  BP: 110/64  Pulse: (!) 52   Body mass index is 29.61 kg/m. Weight: 186 lb 4 oz (84.5 kg)  General: Well developed, well nourished, no acute distress Head: Normocephalic and atraumatic Eyes: Sclerae anicteric, EOMI Lungs: Clear throughout to auscultation Heart: Regular rate and rhythm;  No murmurs, rubs or bruits Abdomen: Soft, non tender and non distended. No masses, hepatosplenomegaly or hernias noted. Normal Bowel sounds Rectal: Deferred Musculoskeletal: Symmetrical with no gross deformities   Review of Data  The following data was reviewed at the time of this encounter:  Laboratory Studies      Latest Ref Rng & Units 08/18/2023   11:06 AM 06/18/2023    7:26 AM 02/09/2023    7:38 AM  CBC  WBC 4.0 - 10.5 K/uL 7.1  6.4  7.5   Hemoglobin 13.0 - 17.0 g/dL 01.0  27.2  53.6   Hematocrit 39.0 - 52.0 % 47.1  45.2  44.7   Platelets 150.0 - 400.0 K/uL 273.0  229.0  232.0     No results found for: "LIPASE"    Latest Ref Rng & Units 08/18/2023   11:06 AM 06/18/2023    7:26 AM 02/09/2023    7:38 AM  CMP  Glucose 70 - 99 mg/dL 92  88  88   BUN 6 - 23 mg/dL 16  20  17    Creatinine 0.40 - 1.50 mg/dL 6.44  0.34  7.42   Sodium 135 - 145 mEq/L 136  144  140   Potassium 3.5 - 5.1 mEq/L 4.1  4.5  4.4   Chloride 96 - 112  mEq/L 104  110  106   CO2 19 - 32 mEq/L 26  27  28    Calcium  8.4 - 10.5 mg/dL 8.9  8.9  8.8   Total Protein 6.0 - 8.3 g/dL 6.4  5.7  5.9   Total Bilirubin 0.2 - 1.2 mg/dL 0.6  0.5  0.6   Alkaline Phos 39 - 117 U/L 63  55  50   AST 0 - 37 U/L 18  18  21    ALT 0 - 53 U/L 20  20  16     IBD Labs  Prebiologic Labs Hepatitis B surface antigen negative Hepatitis B surface antibody negative Hepatitis B core antibody total negative Hepatitis C antibody negative QuantiFERON gold negative  Therapeutic Drug Monitoring 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 No acute abnormality.   CTAP 02/2016 Normal kidneys and bladder. There is no hydronephrosis bilaterally.There is a 2 mm calcific density to the right of midline in the prostate gland, favor prostate calcification rather than calcification in the urethra.   No acute abnormality identified in the abdomen and pelvis.  GI Procedures and Studies  Colonoscopy 12/2023 Congested mucosa throughout the entire colon without erythema, erosions or ulceration Normal TI IH Path: Chronic colitis with focal mild activity throughout the entire colon; chronic ileitis with focal mild activity in TI  EGD/Colonoscopy 05/2023 EGD - LA grade A esophagitis, HH, mildly erythematous mucosa in stomach Colonoscopy - diffusely granular mucosa throughout the colon, normal TI Path: Reactive gastropathy, mildly increased IEL's and duodenum, mild active colitis in right colon, focal chronic colitis in left colon  Colonoscopy 11/2014 Diffusely granular and slightly nodular mucosa throughout the colon Rectum was erythematous and slightly inflamed tapering to normal mucosa at 15 cm Path: Focal active colitis  on random  biopsies throughout colon and in rectum  Clinical Impression  It is my clinical impression that Mr. Wellen is a 63 y.o. male with;  IBD-U/indeterminant colitis -most likely ulcerative colitis GERD History of LA grade A esophagitis Hiatal hernia Family history of colorectal cancer in a first-degree relative -mother Family history of Crohn's disease - sister  Jaime Curtis presents to the office at Dr. Marolyn Sis request for an opinion regarding his diagnosis and management of inflammatory bowel disease.  In reviewing his chart, it is noteworthy that he has had abnormal appearing colonic mucosa dating back to 2016 with histologic changes of focal active colitis that have evolved over time into chronic colitis.  His most recent colonoscopy demonstrated endoscopically mild colonic inflammation.  His terminal ileum was normal, however, biopsies showed chronic ileitis.  This raises the differential diagnosis of backwash ileitis versus Crohn's disease.  CTE was unremarkable albeit this was after he had completed a course of steroids.  At this juncture, we will continue to phenotype his disease as ulcerative colitis.  At today's visit, I discussed with Jaime Curtis that he has a form of inflammatory bowel disease that I would currently phenotype as IBD-U/indeterminate colitis.  We reviewed that at the present time the vast majority of medications available can be used in either condition such that diagnostic differentiation is not imperative at this juncture.  Reviewed that IBD serologies could be considered in the future as another data point regarding his disease type.  From a management perspective, his IBD has not responded to mesalamine  and has demonstrated a waning of response to corticosteroids.  Reviewed that there has been a shift away from thiopurine monotherapy and IBD treatment given deleterious long-term side effects related to immune suppression, dermatologic malignancies and lymphomas.  I also  advised avoiding sphingosine 1 phosphate receptor modulators given his history of bradycardia which can be exacerbated by that class of medications.  Jaime Curtis had inquired about other oral options.  Discussed that Jak inhibitors are currently only FDA approved for patients who have failed or are intolerant to anti-TNF therapy.  My recommendation at this time was to consider initiation of biologic therapy for his moderate form of IBD-U/indeterminate colitis that is not robustly responding to steroids.  Given that he has an indeterminant phenotype, we focused on medications that have reasonable success for treating both Crohn's and UC and specifically reviewed Humira , Stelara and Skyrizi.  I reviewed the mechanism of action of these medications, potential side effects, administration and long-term monitoring.  We discussed that one of the benefits of Humira  would be potentially faster onset of action than anti-IL 1223 therapy.  That being said Stelara and Skyrizi are more favorable from an infection and immune suppression standpoint.  They could, however, take longer to take effect requiring a more prolonged course of corticosteroids.  I also discussed with Jaime Curtis that sometimes insurance plays a role in determining which medications will ultimately be covered.  After our visit, I sent Jaime Curtis a detailed MyChart message outlining aspects of the various medications and encouraged him to review the information as part of the decision-making process.  References were also provided from the Crohn's and Colitis Foundation.  Plan  Reviewed medication options of Humira , Stelara, Skyrizi-Jaime Curtis will consider these options in further detail and let the office know how he would like to proceed Continue prednisone  5 mg orally daily for now-can dose increase if needed based upon symptoms Prebiologic laboratory studies are up-to-date As he embarks on future treatment recommend checking CBC, CMP, ESR  and CRP every 4 to 6  months Monitor disease activity with fecal calprotectin assessment Consider IBD serologies in the future if there is a need/desire for further phenotypic differentiation Continue Nexium  20 mg p.o. daily GERD diet Recommend DEXA scan in 2025 given prolonged corticosteroid use Monitor weight and anthropometrics   IBD Health Maintenance  Vaccinations Influenza: PCV13: PPSV23: COVID19: HAV/HBV: Shingles: HPV:  DEXA Recommend DEXA scan in 2025 given prolonged corticosteroid use  Eye Exam Recommend eye exam in 2025 given prolonged corticosteroid use  Skin Exam Recommend annual skin exam once on immune suppression  Surveillance Colonoscopy Surveillance colonoscopy -consider in 2 to 3 years once felt to be in remission  Tobacco Use None  Depression Screen    Planned Follow Up Jaime Curtis will return to the care of Dr. Brice Campi.  I would be happy to collaborate in his care again in the future should Dr. Brice Campi or Jaime Curtis request my input.  The patient or caregiver verbalized understanding of the material covered, with no barriers to understanding. All questions were answered. Patient or caregiver is agreeable with the plan outlined above.    It was a pleasure to see Jaime Curtis.  If you have any questions or concerns regarding this evaluation, do not hesitate to contact me.  Eugenia Hess, MD [facility]   I spent total of 45 minutes in both face-to-face (30 min interview and discussion) and non-face-to-face (15 min chart review, documentation) activities, excluding procedures performed, for the visit on the date of this encounter.

## 2024-01-24 ENCOUNTER — Encounter: Payer: Self-pay | Admitting: Pediatrics

## 2024-01-24 ENCOUNTER — Ambulatory Visit: Payer: BC Managed Care – PPO | Admitting: Pediatrics

## 2024-01-24 VITALS — BP 110/64 | HR 52 | Ht 66.5 in | Wt 186.2 lb

## 2024-01-24 DIAGNOSIS — K2100 Gastro-esophageal reflux disease with esophagitis, without bleeding: Secondary | ICD-10-CM

## 2024-01-24 DIAGNOSIS — K523 Indeterminate colitis: Secondary | ICD-10-CM

## 2024-01-24 DIAGNOSIS — Z800 Family history of malignant neoplasm of digestive organs: Secondary | ICD-10-CM

## 2024-01-24 DIAGNOSIS — Z8719 Personal history of other diseases of the digestive system: Secondary | ICD-10-CM | POA: Diagnosis not present

## 2024-01-24 DIAGNOSIS — K449 Diaphragmatic hernia without obstruction or gangrene: Secondary | ICD-10-CM | POA: Diagnosis not present

## 2024-01-24 DIAGNOSIS — K219 Gastro-esophageal reflux disease without esophagitis: Secondary | ICD-10-CM | POA: Diagnosis not present

## 2024-01-24 NOTE — Patient Instructions (Signed)
 We have given you pamphlets from the Crohn's & Colitis [employer].    Please follow up with dr. Brice Campi as needed.  Thank you for entrusting me with your care and for choosing [facility], Dr. Eugenia Hess   _______________________________________________________  If your blood pressure at your visit was 140/90 or greater, please contact your primary care physician to follow up on this.  _______________________________________________________  If you are age 77 or older, your body mass index should be between 23-30. Your Body mass index is 29.61 kg/m. If this is out of the aforementioned range listed, please consider follow up with your Primary Care Provider.  If you are age 15 or younger, your body mass index should be between 19-25. Your Body mass index is 29.61 kg/m. If this is out of the aformentioned range listed, please consider follow up with your Primary Care Provider.   ________________________________________________________  The [facility] providers would like to encourage you to use MYCHART to communicate with providers for non-urgent requests or questions.  Due to long hold times on the telephone, sending your provider a message by [facility] may be a faster and more efficient way to get a response.  Please allow 48 business hours for a response.  Please remember that this is for non-urgent requests.  _______________________________________________________

## 2024-01-27 ENCOUNTER — Telehealth: Payer: Self-pay

## 2024-01-27 DIAGNOSIS — K523 Indeterminate colitis: Secondary | ICD-10-CM

## 2024-01-27 MED ORDER — HUMIRA-CD/UC/HS STARTER 80 MG/0.8ML ~~LOC~~ AJKT: AUTO-INJECTOR | SUBCUTANEOUS | Status: DC

## 2024-01-27 MED ORDER — HUMIRA (2 PEN) 40 MG/0.4ML ~~LOC~~ AJKT: 40.0000 mg | AUTO-INJECTOR | SUBCUTANEOUS | Status: DC

## 2024-01-27 NOTE — Telephone Encounter (Signed)
 Dr. Yvone Herd, would you like standard Humira dosing? Humira 160 mg on Day 1 and 80 mg on Day 15. Humira 40 mg every 2 weeks - maintenance

## 2024-01-27 NOTE — Telephone Encounter (Addendum)
 Please initiate PA for Humira as outlined below for induction and Humira dosing.   MyChart message sent to patient with recommendations and plan. See 01/24/24 patient message for details.

## 2024-01-27 NOTE — Telephone Encounter (Signed)
 McGreal, Scarlette Currier, MD  Marline Simons, RN Yes, please prescribe dosing as you outlined:  Induction Day 1 -160 mg subcu x 1 Day 15-80 mg subcu x 1  Maintenance Start 2 weeks after last induction dose 40 mg subcutaneously every 2 weeks  Please advise him to have \\therapeutic  drug monitoring performed 1 to 2 days before his first maintenance dose (Week 4): [facility] adalimumab level and antibody test  The blood work will help guide future maintenance dosing.  If he does not achieve a therapeutic drug level with induction dosing we would escalate maintenance to weekly dosing rather than every 2 weeks.  Let me know if you have any questions.  Haskell Linker

## 2024-01-28 ENCOUNTER — Other Ambulatory Visit (HOSPITAL_COMMUNITY): Payer: Self-pay

## 2024-01-28 NOTE — Telephone Encounter (Signed)
 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

## 2024-01-31 ENCOUNTER — Telehealth: Payer: Self-pay

## 2024-01-31 NOTE — Telephone Encounter (Signed)
 Pharmacy Patient Advocate Encounter   Received notification from Pt Calls Messages that prior authorization for Humira  Pen-CD/UC/HS Starter (CF) 80MG /0.8ML auto-injector kit is required/requested.   Insurance verification completed.   The patient is insured through [employer] .   Prior Authorization for Humira  Pen-CD/UC/HS Starter (CF) 80MG /0.8ML auto-injector kit has been DENIED.  Full denial letter will be uploaded to the media tab. See denial reason below.  The requested drug is NON-PREFERRED and coverage is not authorized under the plan. The non-preferred drug is covered when the patient is currently taking the non-preferred product for at least 120 days AND documentation is provided showing that the patient has tried ALL of the following: Cyltezo/adalimumab -adbm, adalimumab -adaz, and Simlandi/adalimumab -ryvk and the patient cannot continue to use ALL preferred medications due to formulation differences in the inactive ingredient(s) [for example, differences in stabilizing agent, buffering agent, and/or surfactant] which, according to the prescriber, would result in a significant allergy or serious adverse reaction. Coverage for the requested product cannot be authorized at this time.  PA #/Case ID/Reference #: [id number]

## 2024-01-31 NOTE — Telephone Encounter (Signed)
 PA request has been Denied. New Encounter has been or will be created for follow up. For additional info see Pharmacy Prior Auth telephone encounter from 01-31-2024.

## 2024-02-01 ENCOUNTER — Telehealth: Payer: Self-pay

## 2024-02-01 ENCOUNTER — Other Ambulatory Visit (HOSPITAL_COMMUNITY): Payer: Self-pay

## 2024-02-01 MED ORDER — ADALIMUMAB-ADAZ 80 MG/0.8ML ~~LOC~~ SOAJ: 2.0000 | SUBCUTANEOUS | 0 refills | Status: DC

## 2024-02-01 MED ORDER — ADALIMUMAB-ADAZ 40 MG/0.4ML ~~LOC~~ SOSY: 1.0000 | PREFILLED_SYRINGE | SUBCUTANEOUS | 11 refills | Status: DC

## 2024-02-01 NOTE — Telephone Encounter (Signed)
 PA request has been Submitted. New Encounter has been or will be created for follow up. For additional info see Pharmacy Prior Auth telephone encounter from 02-01-2024.

## 2024-02-01 NOTE — Telephone Encounter (Signed)
 Pharmacy Patient Advocate Encounter   Received notification from Pt Calls Messages that prior authorization for Adalimumab -adaz 80MG /0.8ML auto-injectors is required/requested.   Insurance verification completed.   The patient is insured through [employer] .   Per test claim: PA required; PA submitted to above mentioned insurance via CoverMyMeds Key/confirmation #/EOC [id number] Status is pending

## 2024-02-01 NOTE — Telephone Encounter (Signed)
 Prescription for adalimumab -Alec American has been sent to [facility]. Please submit PA for bio similar. Thank you

## 2024-02-01 NOTE — Telephone Encounter (Signed)
 Pharmacy Patient Advocate Encounter  Additional information has been requested from the patient's insurance in order to proceed with the prior authorization request. Requested information has been sent, or form has been filled out and faxed back to [phone]

## 2024-02-02 ENCOUNTER — Other Ambulatory Visit (HOSPITAL_COMMUNITY): Payer: Self-pay

## 2024-02-02 ENCOUNTER — Telehealth: Payer: Self-pay

## 2024-02-02 NOTE — Telephone Encounter (Signed)
MyChart message sent to patient with approval information.

## 2024-02-02 NOTE — Telephone Encounter (Signed)
 Pharmacy Patient Advocate Encounter  Received notification from [facility] that Prior Authorization for Adalimumab -adaz 80MG /0.8ML auto-injectors has been APPROVED from 01-02-2024 to 03-02-2024   PA #/Case ID/Reference #: [id number]

## 2024-02-02 NOTE — Telephone Encounter (Signed)
 Pharmacy Patient Advocate Encounter   Received notification from Pt Calls Messages that prior authorization for Adalimumab -adaz 40MG /0.4ML auto-injectors is required/requested.   Insurance verification completed.   The patient is insured through [employer] .   Prior Authorization for Adalimumab -adaz 40MG /0.4ML auto-injectors has been APPROVED from 01-03-2024 to 07-31-2024   PA #/Case ID/Reference #: [id number]

## 2024-02-11 ENCOUNTER — Other Ambulatory Visit (HOSPITAL_COMMUNITY): Payer: Self-pay

## 2024-02-11 NOTE — Telephone Encounter (Signed)
 No PA required. Must be filled for the generic Adalimumab -adaz and NOT the brand Hyrimoz .

## 2024-02-11 NOTE — Telephone Encounter (Signed)
 Noted. Refer back to phone note 02/01/24.

## 2024-02-11 NOTE — Telephone Encounter (Signed)
 Received additional forms from [facility]. Unsure if it is still needed since PA was approved. Faxed to PA team for review if needed.

## 2024-02-15 NOTE — Telephone Encounter (Signed)
 Returned call to [facility] pharmacy and spoke with [employer]. I was informed that patient needs to enroll in "Save-on-program" before induction dose of Adalimumab -adaz 80 mg can be dispensed. Patient will need to call [phone], once he is enrolled in program he needs to call [facility] pharmacy at [phone] to set up shipment of medication. MyChart message sent to patient with this information.   I was informed that they have prior authorizations on file for Adalimumab -adaz 80 mg and Adalimumab -adaz 40 mg.   Case ID: [id number]

## 2024-02-15 NOTE — Telephone Encounter (Signed)
 [facility] pharmacy is requesting a call to discuss PA further. Call back number is [phone]. Please advise, thank you.

## 2024-02-18 ENCOUNTER — Encounter: Admitting: Internal Medicine

## 2024-02-21 NOTE — Telephone Encounter (Addendum)
 Called [facility] pharmacy [phone]) to see what information is needed from our office regarding Adalimumab -adaz 80 mg. Spoke with Jeanette Milks, I provided him with the PA approval information for the Adalimumab -adaz 80 mg induction dose. I was informed again by [facility] pharmacy that you need to enroll in "Save-on-program" before induction dose of Adalimumab -adaz 80 mg can be dispensed. You will need to call [phone], this is per your insurance. They will not fill medication until this has been completed. This is something that the patient has to do. [facility] pharmacy also needed clarification on wether patient should receive pen or pre-filled syringe. [facility] was running claim under Hyrimoz , patient's insurance only covers generic. Adalimumab -Alec American does not have starter kit, they will have to dispense 4 pens and patient will need to dispose one for his induction dose. Kayleen Party, pharmacy tech has updated RX in their system. Again, patient has to enroll in [place] program then call back to [facility] to ship medication.  Updated MyChart message sent to patient today.

## 2024-02-24 ENCOUNTER — Other Ambulatory Visit (INDEPENDENT_AMBULATORY_CARE_PROVIDER_SITE_OTHER)

## 2024-02-24 DIAGNOSIS — I10 Essential (primary) hypertension: Secondary | ICD-10-CM

## 2024-02-24 DIAGNOSIS — E538 Deficiency of other specified B group vitamins: Secondary | ICD-10-CM

## 2024-02-24 DIAGNOSIS — R739 Hyperglycemia, unspecified: Secondary | ICD-10-CM | POA: Diagnosis not present

## 2024-02-24 DIAGNOSIS — Z125 Encounter for screening for malignant neoplasm of prostate: Secondary | ICD-10-CM

## 2024-02-24 DIAGNOSIS — E559 Vitamin D deficiency, unspecified: Secondary | ICD-10-CM

## 2024-02-25 ENCOUNTER — Encounter: Admitting: Internal Medicine

## 2024-02-25 LAB — CBC WITH DIFFERENTIAL/PLATELET
Basophils Absolute: 0.1 10*3/uL (ref 0.0–0.1)
Basophils Relative: 1.1 % (ref 0.0–3.0)
Eosinophils Absolute: 0.4 10*3/uL (ref 0.0–0.7)
Eosinophils Relative: 5.5 % — ABNORMAL HIGH (ref 0.0–5.0)
HCT: 46.1 % (ref 39.0–52.0)
Hemoglobin: 15.6 g/dL (ref 13.0–17.0)
Lymphocytes Relative: 22.3 % (ref 12.0–46.0)
Lymphs Abs: 1.6 10*3/uL (ref 0.7–4.0)
MCHC: 33.8 g/dL (ref 30.0–36.0)
MCV: 92.4 fl (ref 78.0–100.0)
Monocytes Absolute: 0.7 10*3/uL (ref 0.1–1.0)
Monocytes Relative: 10.3 % (ref 3.0–12.0)
Neutro Abs: 4.3 10*3/uL (ref 1.4–7.7)
Neutrophils Relative %: 60.8 % (ref 43.0–77.0)
Platelets: 232 10*3/uL (ref 150.0–400.0)
RBC: 4.99 Mil/uL (ref 4.22–5.81)
RDW: 13.4 % (ref 11.5–15.5)
WBC: 7.1 10*3/uL (ref 4.0–10.5)

## 2024-02-25 LAB — LIPID PANEL
Cholesterol: 155 mg/dL (ref 0–200)
HDL: 40.5 mg/dL (ref >39.00)
LDL Cholesterol: 97 mg/dL (ref 0–99)
NonHDL: 114.61
Total CHOL/HDL Ratio: 4
Triglycerides: 88 mg/dL (ref 0.0–149.0)
VLDL: 17.6 mg/dL (ref 0.0–40.0)

## 2024-02-25 LAB — HEPATIC FUNCTION PANEL
ALT: 23 U/L (ref 0–53)
AST: 21 U/L (ref 0–37)
Albumin: 4.3 g/dL (ref 3.5–5.2)
Alkaline Phosphatase: 49 U/L (ref 39–117)
Bilirubin, Direct: 0.2 mg/dL (ref 0.0–0.3)
Total Bilirubin: 0.8 mg/dL (ref 0.2–1.2)
Total Protein: 6 g/dL (ref 6.0–8.3)

## 2024-02-25 LAB — URINALYSIS, ROUTINE W REFLEX MICROSCOPIC
Bilirubin Urine: NEGATIVE
Hgb urine dipstick: NEGATIVE
Ketones, ur: NEGATIVE
Leukocytes,Ua: NEGATIVE
Nitrite: NEGATIVE
RBC / HPF: NONE SEEN (ref 0–?)
Specific Gravity, Urine: >=1.03 — AB (ref 1.000–1.030)
Total Protein, Urine: NEGATIVE
Urine Glucose: NEGATIVE
Urobilinogen, UA: 0.2 (ref 0.0–1.0)
pH: 6 (ref 5.0–8.0)

## 2024-02-25 LAB — BASIC METABOLIC PANEL WITH GFR
BUN: 17 mg/dL (ref 6–23)
CO2: 26 meq/L (ref 19–32)
Calcium: 8.9 mg/dL (ref 8.4–10.5)
Chloride: 105 meq/L (ref 96–112)
Creatinine, Ser: 1.03 mg/dL (ref 0.40–1.50)
GFR: 78.29 mL/min (ref >60.00)
Glucose, Bld: 83 mg/dL (ref 70–99)
Potassium: 3.9 meq/L (ref 3.5–5.1)
Sodium: 139 meq/L (ref 135–145)

## 2024-02-25 LAB — HEMOGLOBIN A1C: Hgb A1c MFr Bld: 5.2 % (ref 4.6–6.5)

## 2024-02-29 LAB — PSA: PSA: 0.5 ng/mL (ref 0.10–4.00)

## 2024-02-29 LAB — VITAMIN B12: Vitamin B-12: 568 pg/mL (ref 211–911)

## 2024-02-29 LAB — VITAMIN D 25 HYDROXY (VIT D DEFICIENCY, FRACTURES): VITD: 34.03 ng/mL (ref 30.00–100.00)

## 2024-02-29 LAB — TSH: TSH: 1.41 u[IU]/mL (ref 0.35–5.50)

## 2024-03-02 ENCOUNTER — Telehealth: Payer: Self-pay

## 2024-03-02 ENCOUNTER — Encounter: Admitting: Internal Medicine

## 2024-03-02 ENCOUNTER — Ambulatory Visit (INDEPENDENT_AMBULATORY_CARE_PROVIDER_SITE_OTHER): Admitting: Internal Medicine

## 2024-03-02 ENCOUNTER — Other Ambulatory Visit (HOSPITAL_COMMUNITY): Payer: Self-pay

## 2024-03-02 VITALS — BP 136/82 | HR 53 | Temp 97.8°F | Ht 66.5 in | Wt 183.0 lb

## 2024-03-02 DIAGNOSIS — E538 Deficiency of other specified B group vitamins: Secondary | ICD-10-CM

## 2024-03-02 DIAGNOSIS — Z0000 Encounter for general adult medical examination without abnormal findings: Secondary | ICD-10-CM

## 2024-03-02 DIAGNOSIS — E7800 Pure hypercholesterolemia, unspecified: Secondary | ICD-10-CM

## 2024-03-02 DIAGNOSIS — I10 Essential (primary) hypertension: Secondary | ICD-10-CM | POA: Diagnosis not present

## 2024-03-02 DIAGNOSIS — Z125 Encounter for screening for malignant neoplasm of prostate: Secondary | ICD-10-CM

## 2024-03-02 DIAGNOSIS — R739 Hyperglycemia, unspecified: Secondary | ICD-10-CM

## 2024-03-02 DIAGNOSIS — E559 Vitamin D deficiency, unspecified: Secondary | ICD-10-CM

## 2024-03-02 DIAGNOSIS — Z0001 Encounter for general adult medical examination with abnormal findings: Secondary | ICD-10-CM

## 2024-03-02 MED ORDER — ATORVASTATIN CALCIUM 40 MG PO TABS: 40.0000 mg | ORAL_TABLET | Freq: Every day | ORAL | 3 refills | Status: AC

## 2024-03-02 MED ORDER — IRBESARTAN 75 MG PO TABS: ORAL_TABLET | ORAL | 3 refills | Status: AC

## 2024-03-02 NOTE — Telephone Encounter (Signed)
 Spoke with patient & he stated he contacted [facility] yesterday and was told there is a delay in the shipment, and that the prior auth expires today. Advised him I would call [facility] and find out an update & get back with him.

## 2024-03-02 NOTE — Telephone Encounter (Signed)
 Patient requesting a call to discuss medication and previous note  further. Please advise, thank you

## 2024-03-02 NOTE — Telephone Encounter (Signed)
 Receive notification from PA team:   Pharmacy Patient Advocate Encounter   Received notification from Pt Calls Messages that prior authorization extension for Adalimumab -adaz 80MG /0.8ML auto-injectors is required/requested.   Insurance verification completed.   The patient is insured through [employer] .   Prior Authorization extension for Adalimumab -adaz 80MG /0.8ML auto-injectors has been APPROVED from 02-01-2024 to 04-01-2024    PA #/Case ID/Reference #: [id number]

## 2024-03-02 NOTE — Telephone Encounter (Signed)
 Spoke with [facility] rep & he stated the delivery for the Adalimumab -adaz 80 mg is set for 5/29, however the prior authorization expires today and will need a new one. Made rep aware that we were told this was taken care of last week, but he stated d/t their shipment times we have ran into this issue. He's requested a PLA (patient level authorization) be placed.

## 2024-03-02 NOTE — Telephone Encounter (Signed)
 Refer to phone note 02/02/24.

## 2024-03-02 NOTE — Progress Notes (Signed)
 Patient ID: Jaime Curtis, male   DOB: 1960-10-29, 63 y.o.   MRN: [id number]         Chief Complaint:: wellness exam and low vit d , htn, hld, low b12       HPI:  Jaime Curtis is a 63 y.o. male here for wellness exam; for shingrix at pharmacy, decliens prevnar 20, o/w up to date                       Also Pt denies chest pain, increased sob or doe, wheezing, orthopnea, PND, increased LE swelling, palpitations, dizziness or syncope.   Pt denies polydipsia, polyuria, or new focal neuro s/s.    Pt denies fever, wt loss, night sweats, loss of appetite, or other constitutional symptoms  Not taking Vit D lately.     Wt Readings from Last 3 Encounters:  03/02/24 183 lb (83 kg)  01/24/24 186 lb 4 oz (84.5 kg)  12/14/23 187 lb (84.8 kg)   BP Readings from Last 3 Encounters:  03/02/24 136/82  01/24/24 110/64  12/14/23 110/74   Immunization History  Administered Date(s) Administered   Influenza Inj Mdck Quad Pf 08/06/2020   Influenza Split 07/10/2011, 08/01/2012   Influenza Whole 07/07/2010   Influenza,inj,Quad PF,6+ Mos 08/12/2014, 07/30/2019, 08/09/2022   Influenza-Unspecified 07/26/2016, 06/18/2017, 07/26/2018, 08/10/2023   PFIZER(Purple Top)SARS-COV-2 Vaccination 11/16/2019, 12/07/2019, 08/10/2020, 05/16/2021, 07/17/2021   Td 07/01/2009   Tdap 01/16/2020   Health Maintenance Due  Topic Date Due   Zoster Vaccines- Shingrix (1 of 2) Never done      Past Medical History:  Diagnosis Date   ALLERGIC RHINITIS 12/07/2007   Allergy    Arthritis    Bradycardia, sinus 07/10/2011   CHEST PAIN 12/07/2007   DARK URINE 12/07/2007   Family history of colon cancer 08/11/2013   mother, age 48   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 He has never used smokeless tobacco. He reports current alcohol use. He reports that he does not use drugs. family history includes Cancer in his maternal grandmother; Colon cancer (age of onset: 68) in his mother; Crohn's disease in his sister; Diabetes in his sister; Heart attack in his sister; Sudden death in his father. Allergies  Allergen Reactions   Pseudoephedrine Other (See Comments)   Current Outpatient Medications on File Prior to Visit  Medication Sig Dispense Refill   Adalimumab -adaz 40 MG/0.4ML SOSY Inject 1 pen  into the skin every 14 (fourteen) days. 0.8 mL 11   Adalimumab -adaz 80 MG/0.8ML SOAJ Inject 2 pens. into the skin as directed. INJECT 2 PENS (160 MG) INTO THE SKIN ON DAY 1, INJECT 1 PEN INTO THE SKIN ON DAY 15 2.4 mL 0   aspirin 81 MG tablet Take 81 mg by mouth daily.  esomeprazole  (NEXIUM ) 20 MG capsule Take 1 capsule (20 mg total) by mouth daily at 12 noon. 90 capsule 2   Sodium Fluoride (PREVIDENT 5000 BOOSTER PLUS) 1.1 % PSTE Dental; Duration: 24 Days     No current facility-administered medications on file prior to visit.        ROS:  All others reviewed and negative.  Objective        PE:  BP 136/82 (BP Location: Left Arm, Patient Position: Sitting, Cuff Size: Normal)   Pulse (!) 53   Temp 97.8 F (36.6 C) (Oral)   Ht 5' 6.5" (1.689 m)   Wt 183 lb (83 kg)   SpO2 98%   BMI 29.09 kg/m                 Constitutional: Pt appears in NAD               HENT: Head: NCAT.                Right Ear: External ear normal.                 Left Ear: External ear normal.                Eyes: . Pupils are equal, round, and reactive to light. Conjunctivae and  EOM are normal               Nose: without d/c or deformity               Neck: Neck supple. Gross normal ROM               Cardiovascular: Normal rate and regular rhythm.                 Pulmonary/Chest: Effort normal and breath sounds without rales or wheezing.                Abd:  Soft, NT, ND, + BS, no organomegaly               Neurological: Pt is alert. At baseline orientation, motor grossly intact               Skin: Skin is warm. No rashes, no other new lesions, LE edema - none               Psychiatric: Pt behavior is normal without agitation   Micro: none  Cardiac tracings I have personally interpreted today:  none  Pertinent Radiological findings (summarize): none   Lab Results  Component Value Date   WBC 7.1 02/24/2024   HGB 15.6 02/24/2024   HCT 46.1 02/24/2024   PLT 232.0 02/24/2024   GLUCOSE 83 02/24/2024   CHOL 155 02/24/2024   TRIG 88.0 02/24/2024   HDL 40.50 02/24/2024   LDLCALC 97 02/24/2024   ALT 23 02/24/2024   AST 21 02/24/2024   NA 139 02/24/2024   K 3.9 02/24/2024   CL 105 02/24/2024   CREATININE 1.03 02/24/2024   BUN 17 02/24/2024   CO2 26 02/24/2024   TSH 1.41 02/24/2024   PSA 0.50 02/24/2024   HGBA1C 5.2 02/24/2024   MICROALBUR 1.1 02/20/2016   Assessment/Plan:  Jaime Curtis is a 63 y.o. White or Caucasian [1] male with  has a past medical history of ALLERGIC RHINITIS (12/07/2007), Allergy, Arthritis, Bradycardia, sinus (07/10/2011), CHEST PAIN (12/07/2007), DARK URINE (12/07/2007), Family history of colon cancer (08/11/2013), Hearing loss in right ear, HEMATOCHEZIA (10/03/2007), High blood pressure,  HYPERLIPIDEMIA (12/07/2007), Hypertension (12/12/2016), NECK PAIN (11/18/2010), Pure hypercholesterolemia (08/18/2007), SINUSITIS- ACUTE-NOS (11/25/2009), and URI (11/18/2010).  Preventative health care Age and sex appropriate education and counseling updated with regular exercise and diet Referrals for preventative services - none  needed Immunizations addressed - none needed Smoking counseling  - none needed Evidence for depression or other mood disorder - none significant Most recent labs reviewed. I have personally reviewed and have noted: 1) the patient's medical and social history 2) The patient's current medications and supplements 3) The patient's height, weight, and BMI have been recorded in the chart   Pure hypercholesterolemia Lab Results  Component Value Date   LDLCALC 97 02/24/2024   Stable, pt to continue current statin lipitor 40 mg qd   Hypertension BP Readings from Last 3 Encounters:  03/02/24 136/82  01/24/24 110/64  12/14/23 110/74   Mild uncontrolled, pt states controlled at home, pt to continue medical treatment avapro  75 qd   B12 deficiency Lab Results  Component Value Date   VITAMINB12 568 02/24/2024   Stable, cont oral replacement - b12 1000 mcg qd   Vitamin D  deficiency Last vitamin D  Lab Results  Component Value Date   VD25OH 34.03 02/24/2024   Low, to restart oral replacement  Followup: Return in about 1 year (around 03/02/2025).  Rosalia Colonel, MD 03/04/2024 11:19 AM [facility] [address] [facility] [address] [facility]

## 2024-03-02 NOTE — Telephone Encounter (Signed)
 Called [facility] & verified that they are able to see the updated prior auth approval. Pt should expect shipment on 5/29. Pt made aware.

## 2024-03-02 NOTE — Patient Instructions (Signed)
Please continue all other medications as before, and refills have been done if requested.  Please have the pharmacy call with any other refills you may need.  Please continue your efforts at being more active, low cholesterol diet, and weight control.  You are otherwise up to date with prevention measures today.  Please keep your appointments with your specialists as you may have planned  Please make an Appointment to return for your 1 year visit, or sooner if needed, with Lab testing by Appointment as well, to be done about 3-5 days before at the FIRST FLOOR Lab (so this is for TWO appointments - please see the scheduling desk as you leave)    

## 2024-03-02 NOTE — Telephone Encounter (Signed)
 Pharmacy Patient Advocate Encounter   Received notification from Pt Calls Messages that prior authorization extension for Adalimumab -adaz 80MG /0.8ML auto-injectors is required/requested.   Insurance verification completed.   The patient is insured through [employer] .  Prior Authorization extension for Adalimumab -adaz 80MG /0.8ML auto-injectors has been APPROVED from 02-01-2024 to 04-01-2024   PA #/Case ID/Reference #: [id number]

## 2024-03-04 ENCOUNTER — Encounter: Payer: Self-pay | Admitting: Internal Medicine

## 2024-03-04 NOTE — Addendum Note (Signed)
 Addended by: Roslyn Coombe on: 03/04/2024 11:21 AM   Modules accepted: Orders

## 2024-03-04 NOTE — Assessment & Plan Note (Signed)
 Lab Results  Component Value Date   VITAMINB12 568 02/24/2024   Stable, cont oral replacement - b12 1000 mcg qd

## 2024-03-04 NOTE — Assessment & Plan Note (Signed)
 Last vitamin D  Lab Results  Component Value Date   VD25OH 34.03 02/24/2024   Low, to restart oral replacement

## 2024-03-04 NOTE — Assessment & Plan Note (Signed)
 Lab Results  Component Value Date   LDLCALC 97 02/24/2024   Stable, pt to continue current statin lipitor 40 mg qd

## 2024-03-04 NOTE — Assessment & Plan Note (Signed)

## 2024-03-04 NOTE — Assessment & Plan Note (Signed)
 BP Readings from Last 3 Encounters:  03/02/24 136/82  01/24/24 110/64  12/14/23 110/74   Mild uncontrolled, pt states controlled at home, pt to continue medical treatment avapro  75 qd

## 2024-03-15 ENCOUNTER — Other Ambulatory Visit: Payer: Self-pay | Admitting: Pediatrics

## 2024-03-20 NOTE — Telephone Encounter (Signed)
 Patient has already completed starter dose of Adalimumab -adaz. Refill not needed.

## 2024-03-24 ENCOUNTER — Telehealth: Payer: Self-pay | Admitting: Pediatrics

## 2024-03-24 NOTE — Telephone Encounter (Signed)
 Returned call to [employer] and spoke with [employer]. I informed them that we denied refill request for Adalimumab -adaz 80 mg because that was the starter dose. Patient's next fill should be Adalimumab -adaz 40 mg. Angel updated patient's account with denial for 80 mg, Adalimumab -adaz 40 mg prescription is active in patient's chart. Patient can call to schedule shipment. Dellar Fenton did not have a reference # for this call.

## 2024-03-24 NOTE — Telephone Encounter (Signed)
 Linda with [facility] called regarding this patient medication Adalimumab -adaz 80 MG/ auto injectors. And they would like to bring attention to the patient medication coming to a renewal with patient insurance company and would like us  to start the prior authorization so this patient does not have issues getting his medication. Stana Ear is requesting a call back at [phone] Ext: [phone]. Please advise.

## 2024-04-04 ENCOUNTER — Other Ambulatory Visit

## 2024-04-04 DIAGNOSIS — K599 Functional intestinal disorder, unspecified: Secondary | ICD-10-CM | POA: Diagnosis not present

## 2024-04-04 DIAGNOSIS — K5190 Ulcerative colitis, unspecified, without complications: Secondary | ICD-10-CM | POA: Diagnosis not present

## 2024-04-04 DIAGNOSIS — K523 Indeterminate colitis: Secondary | ICD-10-CM | POA: Diagnosis not present

## 2024-04-10 ENCOUNTER — Encounter: Payer: Self-pay | Admitting: Pediatrics

## 2024-04-10 LAB — ADALIMUMAB+AB (SERIAL MONITOR)
Adalimumab Drug Level: 15 ug/mL
Anti-Adalimumab Antibody: <25 ng/mL

## 2024-04-10 LAB — SERIAL MONITORING

## 2024-05-22 ENCOUNTER — Encounter: Payer: Self-pay | Admitting: Pediatrics

## 2024-05-23 ENCOUNTER — Other Ambulatory Visit (HOSPITAL_COMMUNITY): Payer: Self-pay

## 2024-05-23 NOTE — Telephone Encounter (Signed)
 Spoke with [facility] and was informed that there was a policy changed in July that is preventing them from dispensing the adalimumab -adaz formulation. They are still able to dispense the ryvk and adbm though. Was not able to tell me the policy change or why it was enforced.  I did a test claim for the medication with [facility] and it goes through with no issues at a $0.00 co-pay if patient would like to switch pharmacies. This will also prevent us  from having to process yet another prior authorization and delaying his care.

## 2024-05-24 NOTE — Telephone Encounter (Signed)
 Agree with Dr. Suzann, OK with substitution. Please forward me anything that I need to do in regards to making the substitution. Thank you. GM

## 2024-05-25 ENCOUNTER — Other Ambulatory Visit (HOSPITAL_COMMUNITY): Payer: Self-pay

## 2024-05-25 ENCOUNTER — Telehealth: Payer: Self-pay

## 2024-05-25 ENCOUNTER — Other Ambulatory Visit: Payer: Self-pay

## 2024-05-25 MED ORDER — SIMLANDI (2 PEN) 40 MG/0.4ML ~~LOC~~ AJKT: 1.0000 | AUTO-INJECTOR | SUBCUTANEOUS | 6 refills | Status: DC

## 2024-05-25 MED ORDER — ADALIMUMAB-ADAZ 40 MG/0.4ML ~~LOC~~ SOSY
1.0000 "pen " | PREFILLED_SYRINGE | SUBCUTANEOUS | 11 refills | Status: AC
  Filled 2024-05-30: qty 0.8, 28d supply, fill #0
  Filled 2024-06-21 – 2024-06-22 (×2): qty 0.8, 28d supply, fill #1
  Filled 2024-07-17: qty 0.8, 28d supply, fill #2
  Filled 2024-08-09 – 2024-08-21 (×2): qty 0.8, 28d supply, fill #3
  Filled 2024-09-12: qty 0.8, 28d supply, fill #4
  Filled 2024-10-03: qty 0.8, 28d supply, fill #5
  Filled 2024-11-07 – 2024-11-08 (×3): qty 0.8, 28d supply, fill #6

## 2024-05-25 NOTE — Telephone Encounter (Signed)
 Yes, that is correct

## 2024-05-25 NOTE — Telephone Encounter (Signed)
 The medication has been sent to [facility] pharmacy- the pt agrees to try.

## 2024-05-25 NOTE — Telephone Encounter (Signed)
 So he can stay on his current treatment if he fills at [facility]?

## 2024-05-25 NOTE — Addendum Note (Signed)
 Addended by: ANITRA ODETTA CROME on: 05/25/2024 12:06 PM   Modules accepted: Orders

## 2024-05-26 ENCOUNTER — Other Ambulatory Visit: Payer: Self-pay

## 2024-05-29 ENCOUNTER — Encounter: Payer: Self-pay | Admitting: Gastroenterology

## 2024-05-29 ENCOUNTER — Other Ambulatory Visit (HOSPITAL_COMMUNITY): Payer: Self-pay

## 2024-05-30 ENCOUNTER — Other Ambulatory Visit (HOSPITAL_COMMUNITY): Payer: Self-pay

## 2024-05-30 ENCOUNTER — Other Ambulatory Visit: Payer: Self-pay

## 2024-05-30 NOTE — Progress Notes (Signed)
 Specialty Pharmacy Initiation Note   Jaime Curtis is a 63 y.o. male who will be followed by the specialty pharmacy service for RxSp Ulcerative Colitis    Review of administration, indication, effectiveness, safety, potential side effects, storage/disposable, and missed dose instructions occurred today for patient's specialty medication(s) Adalimumab -adaz     Patient/Caregiver did not have any additional questions or concerns.   Patient's therapy is appropriate to: Continue (Patient already established on treatment with good results, transitioning pharmacies from [facility].)    Goals Addressed             This Visit's Progress    Minimize recurrence of flares       Patient is on track. Patient will maintain adherence. Patient already established on treatment with good results, transitioning pharmacies from [facility].          Khira Cudmore M Myesha Stillion Specialty Pharmacist

## 2024-05-30 NOTE — Progress Notes (Signed)
 Specialty Pharmacy Initial Fill Coordination Note  Jaime Curtis is a 63 y.o. male contacted today regarding initial fill of specialty medication(s) Adalimumab -adaz   Patient requested Delivery   Delivery date: 06/01/24   Verified address: [address]   [place] [address]   Medication will be filled on 05-31-2024.   Patient is aware of $0.00 copayment.

## 2024-05-31 ENCOUNTER — Other Ambulatory Visit: Payer: Self-pay

## 2024-06-01 ENCOUNTER — Encounter (HOSPITAL_COMMUNITY): Payer: Self-pay

## 2024-06-02 ENCOUNTER — Other Ambulatory Visit (HOSPITAL_COMMUNITY): Payer: Self-pay

## 2024-06-21 ENCOUNTER — Encounter (INDEPENDENT_AMBULATORY_CARE_PROVIDER_SITE_OTHER): Payer: Self-pay

## 2024-06-22 ENCOUNTER — Other Ambulatory Visit: Payer: Self-pay

## 2024-06-22 ENCOUNTER — Other Ambulatory Visit (HOSPITAL_COMMUNITY): Payer: Self-pay

## 2024-06-22 NOTE — Progress Notes (Signed)
 Specialty Pharmacy Refill Coordination Note  MyChart Questionnaire Submission  Jaime Curtis is a 63 y.o. male contacted today regarding refills of specialty medication(s) Adalimumab -adaz.  Doses on hand: (Patient-Rptd) 0   Injection date: (Patient-Rptd) 06/29/24  Patient requested: (Patient-Rptd) Delivery   Delivery date: 06/27/24  Verified address: [address] [place] [address]  Medication will be filled on 06/26/24.

## 2024-06-26 ENCOUNTER — Other Ambulatory Visit: Payer: Self-pay

## 2024-07-17 ENCOUNTER — Other Ambulatory Visit (HOSPITAL_COMMUNITY): Payer: Self-pay

## 2024-07-17 ENCOUNTER — Other Ambulatory Visit: Payer: Self-pay

## 2024-07-17 ENCOUNTER — Encounter (INDEPENDENT_AMBULATORY_CARE_PROVIDER_SITE_OTHER): Payer: Self-pay

## 2024-07-17 NOTE — Progress Notes (Signed)
 Specialty Pharmacy Refill Coordination Note  MyChart Questionnaire Submission  Jaime Curtis is a 63 y.o. male contacted today regarding refills of specialty medication(s) Adalimumab -adaz 40 MG/0.4ML SOSY.  Doses on hand: (Patient-Rptd) 0   Injection date: (Patient-Rptd) 07/27/24  Patient requested: (Patient-Rptd) Delivery   Delivery date: 07/19/24  Verified address: [address] [place] [address]  Medication will be filled on 07/18/24.

## 2024-07-18 ENCOUNTER — Other Ambulatory Visit (HOSPITAL_COMMUNITY): Payer: Self-pay

## 2024-07-18 ENCOUNTER — Other Ambulatory Visit: Payer: Self-pay

## 2024-08-09 ENCOUNTER — Other Ambulatory Visit: Payer: Self-pay

## 2024-08-09 NOTE — Progress Notes (Signed)
 Specialty Pharmacy Refill Coordination Note  Jaime Curtis is a 63 y.o. male contacted today regarding refills of specialty medication(s) Adalimumab -adaz   Patient requested Delivery   Delivery date: 08/22/24   Verified address: [address] [place], [place] [address]   Medication will be filled on: 08/21/24  Sent patient mychart message with updated delivery date.

## 2024-08-21 ENCOUNTER — Other Ambulatory Visit (HOSPITAL_COMMUNITY): Payer: Self-pay

## 2024-08-21 ENCOUNTER — Other Ambulatory Visit: Payer: Self-pay

## 2024-08-21 ENCOUNTER — Telehealth: Payer: Self-pay

## 2024-08-21 NOTE — Telephone Encounter (Signed)
 Pharmacy Patient Advocate Encounter  Received notification from [facility] that Prior Authorization for Adalimumab -adaz 40MG /0.4ML auto-injectors has been APPROVED from 07-22-2024 to 08-21-2025   PA #/Case ID/Reference #: [id number]

## 2024-08-21 NOTE — Telephone Encounter (Signed)
 Pharmacy Patient Advocate Encounter   Received notification from Pt Calls Messages that prior authorization for Adalimumab -adaz 40MG /0.4ML auto-injectors is required/requested.   Insurance verification completed.   The patient is insured through [employer].   Per test claim: PA required; PA submitted to above mentioned insurance via Latent Key/confirmation #/EOC [id number] Status is pending

## 2024-08-21 NOTE — Progress Notes (Signed)
 Specialty Pharmacy Refill Coordination Note  Jaime Curtis is a 63 y.o. male contacted today regarding refills of specialty medication(s) Adalimumab -adaz   Patient requested Delivery   Delivery date: 08/23/24   Verified address: [address] [place], [place] [address]   Medication will be filled on: 08/22/24

## 2024-09-12 ENCOUNTER — Other Ambulatory Visit: Payer: Self-pay

## 2024-09-13 ENCOUNTER — Other Ambulatory Visit: Payer: Self-pay

## 2024-09-13 ENCOUNTER — Other Ambulatory Visit: Payer: Self-pay | Admitting: Pharmacy Technician

## 2024-09-13 NOTE — Progress Notes (Signed)
 Specialty Pharmacy Refill Coordination Note  Jaime Curtis is a 63 y.o. male contacted today regarding refills of specialty medication(s) Adalimumab -adaz   Patient requested (Patient-Rptd) Delivery   Delivery date: 09/15/2024 Verified address: (Patient-Rptd) [address] [place] [address]   Medication will be filled on: 09/14/2024

## 2024-09-14 ENCOUNTER — Other Ambulatory Visit: Payer: Self-pay

## 2024-10-03 ENCOUNTER — Other Ambulatory Visit: Payer: Self-pay

## 2024-10-04 ENCOUNTER — Other Ambulatory Visit (HOSPITAL_COMMUNITY): Payer: Self-pay

## 2024-10-04 ENCOUNTER — Other Ambulatory Visit: Payer: Self-pay

## 2024-10-04 NOTE — Progress Notes (Signed)
 Specialty Pharmacy Refill Coordination Note  Jaime Curtis is a 63 y.o. male contacted today regarding refills of specialty medication(s) Adalimumab -adaz   Patient requested Delivery   Delivery date: 10/17/24   Verified address: [address]  [place], [place] [address]   Medication will be filled on: 10/16/24

## 2024-10-06 ENCOUNTER — Other Ambulatory Visit (HOSPITAL_BASED_OUTPATIENT_CLINIC_OR_DEPARTMENT_OTHER): Payer: Self-pay

## 2024-10-06 MED ORDER — ADACEL 5-2-15.5 LF-MCG/0.5 IM SUSY
PREFILLED_SYRINGE | INTRAMUSCULAR | 0 refills | Status: AC
  Filled 2024-10-06: qty 0.5, 1d supply, fill #0

## 2024-10-13 ENCOUNTER — Other Ambulatory Visit (HOSPITAL_COMMUNITY): Payer: Self-pay

## 2024-10-13 MED ORDER — COVID-19 MRNA VAC-TRIS(PFIZER) 30 MCG/0.3ML IM SUSY
0.3000 mL | PREFILLED_SYRINGE | Freq: Once | INTRAMUSCULAR | 0 refills | Status: AC
  Filled 2024-10-13: qty 0.3, 1d supply, fill #0

## 2024-10-16 ENCOUNTER — Other Ambulatory Visit: Payer: Self-pay

## 2024-10-17 ENCOUNTER — Other Ambulatory Visit (HOSPITAL_COMMUNITY): Payer: Self-pay

## 2024-10-17 ENCOUNTER — Other Ambulatory Visit: Payer: Self-pay

## 2024-10-18 ENCOUNTER — Other Ambulatory Visit: Payer: Self-pay

## 2024-11-07 ENCOUNTER — Other Ambulatory Visit: Payer: Self-pay | Admitting: Pharmacy Technician

## 2024-11-07 ENCOUNTER — Other Ambulatory Visit (HOSPITAL_COMMUNITY): Payer: Self-pay

## 2024-11-07 ENCOUNTER — Other Ambulatory Visit: Payer: Self-pay

## 2024-11-07 NOTE — Progress Notes (Signed)
 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 Informed him once he's able to get this settled he can call [facility] back to make sure price runs through properly and to get his medication set for shipment.

## 2024-11-08 ENCOUNTER — Other Ambulatory Visit: Payer: Self-pay

## 2024-11-08 ENCOUNTER — Other Ambulatory Visit (HOSPITAL_COMMUNITY): Payer: Self-pay

## 2024-11-08 NOTE — Progress Notes (Signed)
 Specialty Pharmacy Refill Coordination Note  Jaime Curtis is a 64 y.o. male contacted today regarding refills of specialty medication(s) Adalimumab -adaz   Patient requested Delivery   Delivery date: 11/10/24   Verified address: [address]  [place], [place] [address]   Medication will be filled on: 11/09/24  Called save-on plan to obtain tertiary information, $0.00 copay.

## 2024-11-09 ENCOUNTER — Other Ambulatory Visit: Payer: Self-pay

## 2024-11-14 ENCOUNTER — Other Ambulatory Visit: Payer: Self-pay

## 2025-02-02 ENCOUNTER — Ambulatory Visit: Admitting: Cardiovascular Disease

## 2025-03-06 ENCOUNTER — Encounter: Admitting: Internal Medicine
